# Patient Record
Sex: Female | Born: 1960
Health system: Southern US, Community
[De-identification: ages and names within clinical notes are randomized; demographics above are authoritative.]

## PROBLEM LIST (undated history)

## (undated) DIAGNOSIS — C801 Malignant (primary) neoplasm, unspecified: Secondary | ICD-10-CM

## (undated) HISTORY — PX: BREAST LUMPECTOMY: SHX2

---

## 2009-09-28 DIAGNOSIS — C50919 Malignant neoplasm of unspecified site of unspecified female breast: Secondary | ICD-10-CM

## 2009-09-28 HISTORY — DX: Malignant neoplasm of unspecified site of unspecified female breast: C50.919

## 2009-09-28 HISTORY — PX: BREAST LUMPECTOMY: SHX2

## 2014-01-11 DIAGNOSIS — F329 Major depressive disorder, single episode, unspecified: Secondary | ICD-10-CM | POA: Insufficient documentation

## 2014-01-11 DIAGNOSIS — E782 Mixed hyperlipidemia: Secondary | ICD-10-CM | POA: Insufficient documentation

## 2014-01-11 DIAGNOSIS — G479 Sleep disorder, unspecified: Secondary | ICD-10-CM | POA: Insufficient documentation

## 2014-01-11 DIAGNOSIS — D0510 Intraductal carcinoma in situ of unspecified breast: Secondary | ICD-10-CM | POA: Insufficient documentation

## 2014-01-11 DIAGNOSIS — I10 Essential (primary) hypertension: Secondary | ICD-10-CM | POA: Insufficient documentation

## 2014-01-11 DIAGNOSIS — G43909 Migraine, unspecified, not intractable, without status migrainosus: Secondary | ICD-10-CM | POA: Insufficient documentation

## 2014-01-11 DIAGNOSIS — K7581 Nonalcoholic steatohepatitis (NASH): Secondary | ICD-10-CM | POA: Insufficient documentation

## 2014-01-11 DIAGNOSIS — F32A Depression, unspecified: Secondary | ICD-10-CM | POA: Insufficient documentation

## 2017-06-01 MED FILL — traZODone HCL 50 MG TABS: 50 | 90 days supply | Qty: 90 | Fill #0

## 2017-07-02 DIAGNOSIS — N644 Mastodynia: Secondary | ICD-10-CM | POA: Diagnosis not present

## 2017-07-02 DIAGNOSIS — N950 Postmenopausal bleeding: Secondary | ICD-10-CM | POA: Diagnosis not present

## 2017-07-16 DIAGNOSIS — N950 Postmenopausal bleeding: Secondary | ICD-10-CM | POA: Diagnosis not present

## 2017-08-12 DIAGNOSIS — N882 Stricture and stenosis of cervix uteri: Secondary | ICD-10-CM | POA: Diagnosis not present

## 2017-08-12 DIAGNOSIS — D0510 Intraductal carcinoma in situ of unspecified breast: Secondary | ICD-10-CM | POA: Diagnosis not present

## 2017-08-12 DIAGNOSIS — N858 Other specified noninflammatory disorders of uterus: Secondary | ICD-10-CM | POA: Diagnosis not present

## 2017-08-12 DIAGNOSIS — N950 Postmenopausal bleeding: Secondary | ICD-10-CM | POA: Diagnosis not present

## 2017-12-20 ENCOUNTER — Other Ambulatory Visit: Payer: Self-pay | Admitting: Family Medicine

## 2017-12-20 DIAGNOSIS — Z1231 Encounter for screening mammogram for malignant neoplasm of breast: Secondary | ICD-10-CM

## 2017-12-21 MED FILL — ROSUVASTATIN CALCIUM 5 MG T: 5 | 90 days supply | Qty: 90 | Fill #0

## 2018-01-06 ENCOUNTER — Ambulatory Visit
Admission: RE | Admit: 2018-01-06 | Discharge: 2018-01-06 | Disposition: A | Discharge status: Home / Self Care | Payer: No Typology Code available for payment source | Source: Ambulatory Visit | Attending: Family Medicine | Admitting: Family Medicine

## 2018-01-06 DIAGNOSIS — Z1231 Encounter for screening mammogram for malignant neoplasm of breast: Secondary | ICD-10-CM

## 2018-01-12 ENCOUNTER — Ambulatory Visit: Payer: No Typology Code available for payment source | Admitting: Podiatry

## 2018-01-12 ENCOUNTER — Other Ambulatory Visit: Payer: Self-pay | Admitting: Podiatry

## 2018-01-12 ENCOUNTER — Encounter: Payer: Self-pay | Admitting: Podiatry

## 2018-01-12 ENCOUNTER — Ambulatory Visit (INDEPENDENT_AMBULATORY_CARE_PROVIDER_SITE_OTHER): Payer: No Typology Code available for payment source

## 2018-01-12 VITALS — BP 138/90 | HR 62 | Resp 16

## 2018-01-12 DIAGNOSIS — M2012 Hallux valgus (acquired), left foot: Secondary | ICD-10-CM | POA: Diagnosis not present

## 2018-01-12 DIAGNOSIS — M2011 Hallux valgus (acquired), right foot: Secondary | ICD-10-CM

## 2018-01-12 DIAGNOSIS — M79671 Pain in right foot: Secondary | ICD-10-CM

## 2018-01-12 DIAGNOSIS — M205X9 Other deformities of toe(s) (acquired), unspecified foot: Secondary | ICD-10-CM

## 2018-01-12 DIAGNOSIS — M779 Enthesopathy, unspecified: Secondary | ICD-10-CM | POA: Diagnosis not present

## 2018-01-12 DIAGNOSIS — M2010 Hallux valgus (acquired), unspecified foot: Secondary | ICD-10-CM | POA: Diagnosis not present

## 2018-01-12 MED ORDER — TRIAMCINOLONE ACETONIDE 10 MG/ML IJ SUSP
10.0000 mg | Freq: Once | INTRAMUSCULAR | Status: AC
  Administered 2018-01-12: 10 mg

## 2018-01-12 NOTE — Patient Instructions (Signed)

## 2018-01-12 NOTE — Progress Notes (Signed)
Subjective:   Patient ID: Stacie [name], female   DOB: 57 y.o.   MRN: [id number]   HPI Patient states she is had a long-term problem with her big toe joints with the left being first right 1 second and now currently the right one hurting the most.  Patient states this is been a number of year process and she is tried shoe gear modifications she was going to have surgery 5 years ago which she did not have gradual increase in pain and has reduced her activity levels because of it.  She no longer runs and feels like she walks on the outside of her feet.  Patient does not smoke currently   Review of Systems  All other systems reviewed and are negative.       Objective:  Physical Exam  Constitutional: She appears well-developed and well-nourished.  Cardiovascular: Intact distal pulses.  Pulmonary/Chest: Effort normal.  Musculoskeletal: Normal range of motion.  Neurological: She is alert.  Skin: Skin is warm.  Nursing note and vitals reviewed.   Neurovascular status intact muscle strength adequate range of motion within normal limits with patient found to have significant loss of motion big toe joint right left foot with crepitus of the left first MPJ.  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  Patient does have family history of not successful results and at this time I am sending the ped orthotist for orthotics with some kind of extension of the first metatarsal to try to reduce  the stress against the joint surface  X-rays indicate that there is spurring of the first MPJ bilateral with flattening of the joint surface left over right

## 2018-01-20 ENCOUNTER — Ambulatory Visit (INDEPENDENT_AMBULATORY_CARE_PROVIDER_SITE_OTHER): Payer: No Typology Code available for payment source | Admitting: Orthotics

## 2018-01-20 DIAGNOSIS — M2012 Hallux valgus (acquired), left foot: Secondary | ICD-10-CM

## 2018-01-20 DIAGNOSIS — M2010 Hallux valgus (acquired), unspecified foot: Secondary | ICD-10-CM

## 2018-01-20 DIAGNOSIS — M205X9 Other deformities of toe(s) (acquired), unspecified foot: Secondary | ICD-10-CM

## 2018-01-20 DIAGNOSIS — M2011 Hallux valgus (acquired), right foot: Secondary | ICD-10-CM

## 2018-01-20 NOTE — Progress Notes (Signed)
Patient see today for CMFO per dr. [name].  Patient has hx of valgus deformity of great toe bilat resulting in structural hallux limitus.  This is most likely secondary to pes cavus foot type.   Bilateral morton's extension and hug arch to take pressure off of forefoot.

## 2018-02-15 ENCOUNTER — Ambulatory Visit: Payer: No Typology Code available for payment source | Admitting: Orthotics

## 2018-02-15 DIAGNOSIS — M205X9 Other deformities of toe(s) (acquired), unspecified foot: Secondary | ICD-10-CM

## 2018-02-15 DIAGNOSIS — M2011 Hallux valgus (acquired), right foot: Secondary | ICD-10-CM

## 2018-02-15 DIAGNOSIS — M2012 Hallux valgus (acquired), left foot: Secondary | ICD-10-CM

## 2018-02-15 NOTE — Progress Notes (Signed)
Patient came in today to pick up custom made foot orthotics.  The goals were accomplished and the patient reported no dissatisfaction with said orthotics.  Patient was advised of breakin period and how to report any issues. 

## 2018-03-18 MED FILL — ROSUVASTATIN CALCIUM 5 MG T: 5 | 90 days supply | Qty: 90 | Fill #1

## 2018-04-25 ENCOUNTER — Encounter: Payer: Self-pay | Admitting: Podiatry

## 2018-04-25 ENCOUNTER — Ambulatory Visit: Payer: No Typology Code available for payment source | Admitting: Podiatry

## 2018-04-25 DIAGNOSIS — M779 Enthesopathy, unspecified: Secondary | ICD-10-CM | POA: Diagnosis not present

## 2018-04-25 DIAGNOSIS — M205X9 Other deformities of toe(s) (acquired), unspecified foot: Secondary | ICD-10-CM | POA: Diagnosis not present

## 2018-04-25 MED ORDER — TRIAMCINOLONE ACETONIDE 10 MG/ML IJ SUSP
10.0000 mg | Freq: Once | INTRAMUSCULAR | Status: AC
  Administered 2018-04-25: 10 mg

## 2018-04-25 NOTE — Patient Instructions (Signed)
Pre-Operative Instructions  Congratulations, you have decided to take an important step towards improving your quality of life.  You can be assured that the doctors and staff at [facility] will be with you every step of the way.  Here are some important things you should know:  1. Plan to be at the surgery center/hospital at least 1 (one) hour prior to your scheduled time, unless otherwise directed by the surgical center/hospital staff.  You must have a responsible adult accompany you, remain during the surgery and drive you home.  Make sure you have directions to the surgical center/hospital to ensure you arrive on time. 2. If you are having surgery at [facility] or [facility] hospitals, you will need a copy of your medical history and physical form from your family physician within one month prior to the date of surgery. We will give you a form for your primary physician to complete.  3. We make every effort to accommodate the date you request for surgery.  However, there are times where surgery dates or times have to be moved.  We will contact you as soon as possible if a change in schedule is required.   4. No aspirin/ibuprofen for one week before surgery.  If you are on aspirin, any non-steroidal anti-inflammatory medications (Mobic, Aleve, Ibuprofen) should not be taken seven (7) days prior to your surgery.  You make take Tylenol for pain prior to surgery.  5. Medications - If you are taking daily heart and blood pressure medications, seizure, reflux, allergy, asthma, anxiety, pain or diabetes medications, make sure you notify the surgery center/hospital before the day of surgery so they can tell you which medications you should take or avoid the day of surgery. 6. No food or drink after midnight the night before surgery unless directed otherwise by surgical center/hospital staff. 7. No alcoholic beverages 24-hours prior to surgery.  No smoking 24-hours prior or 24-hours after  surgery. 8. Wear loose pants or shorts. They should be loose enough to fit over bandages, boots, and casts. 9. Don't wear slip-on shoes. Sneakers are preferred. 10. Bring your boot with you to the surgery center/hospital.  Also bring crutches or a walker if your physician has prescribed it for you.  If you do not have this equipment, it will be provided for you after surgery. 11. If you have not been contacted by the surgery center/hospital by the day before your surgery, call to confirm the date and time of your surgery. 12. Leave-time from work may vary depending on the type of surgery you have.  Appropriate arrangements should be made prior to surgery with your employer. 13. Prescriptions will be provided immediately following surgery by your doctor.  Fill these as soon as possible after surgery and take the medication as directed. Pain medications will not be refilled on weekends and must be approved by the doctor. 14. Remove nail polish on the operative foot and avoid getting pedicures prior to surgery. 15. Wash the night before surgery.  The night before surgery wash the foot and leg well with water and the antibacterial soap provided. Be sure to pay special attention to beneath the toenails and in between the toes.  Wash for at least three (3) minutes. Rinse thoroughly with water and dry well with a towel.  Perform this wash unless told not to do so by your physician.  Enclosed: 1 Ice pack (please put in freezer the night before surgery)   1 Hibiclens skin cleaner     Pre-op instructions  If you have any questions regarding the instructions, please do not hesitate to call our office.  [place]: [address], [place], [place] [address] -- [phone]  [place]: [address] [place], [place] [address] -- [phone]  [place]: [address]  , [place] [address] -- [phone]  [place]: [address], [address], [place], [place] [address] -- [phone]  Website:  [email or web address] 

## 2018-04-26 NOTE — Progress Notes (Signed)
Subjective:   Patient ID: Stacie [name], female   DOB: 57 y.o.   MRN: [id number]   HPI Patient presents stating she knows she needs surgery on her right foot but she is going on a hiking trip and wants relief temporarily and then wants to have surgery done and wants to discuss today   ROS      Objective:  Physical Exam  Neurovascular status intact with patient found to have inflammation pain of the first metatarsal phalangeal joint right with fluid buildup around the joint and reduced motion of the joint itself.     Assessment:  Inflammatory capsulitis first MPJ right with hallux limitus deformity     [name]:  H&P x-rays taken today as is been a number of months and reviewed in great detail.  I do recommend a osteotomy type surgery with removal of bone spurs and I explained procedure and risk and today I went ahead and I did inject the joint 3 mg Kenalog 5 Milgram Xylocaine for temporary relief.  I allowed her to go over consent form going over alternative treatments complications and the fact that recovery will take 6 months to 1 year for complete recovery.  Patient wants surgery signed consent form is given all preoperative instructions today.  She understands ultimately she may require fusion or joint implantation procedure and I did dispense air fracture walker today with all instructions on usage  X-ray indicates that there is spurring of the dorsal first metatarsal and moderate narrowing as of the first metatarsal space

## 2018-04-29 ENCOUNTER — Telehealth: Payer: Self-pay | Admitting: Podiatry

## 2018-04-29 NOTE — Telephone Encounter (Signed)
Stacie [name], I met with you on Monday when I saw Dr. [name]. I'm calling to get my surgery scheduled for late October into November sometime. He said he was going to do an osteotomy on my right great toe joint. Please call me back at [phone]. Thank you.

## 2018-05-02 ENCOUNTER — Telehealth: Payer: Self-pay | Admitting: Podiatry

## 2018-05-02 NOTE — Telephone Encounter (Signed)
I called pt to get her surgery scheduled with Dr. [name]. Pt requested her surgery be scheduled on Tuesday 19 November as the following week is Thanksgiving and she will have time off from work. I told the pt while I had her on the phone I could go ahead and get her scheduled to come in before then to sign her consent forms. Pt stated she signed the consent forms when she was here on 29 November. I told the pt that someone from the surgical center would call her 24-48 hours before the date of her surgery to let her know what time to arrive as it depends on any changes in their schedules, if there are kids or patients that are diabetic that need to have surgery first. I confirmed with the pt that she received the brochure for [facility] and that she could go online and register with them but if she was unable to do that, someone from the surgical center would call and get the information over the phone with her. I told the pt to call us with any questions and/or concerns she may have.

## 2018-05-02 NOTE — Telephone Encounter (Signed)
I'm calling to schedule my surgery with Dr. [name]. I would like to schedule it sometime in October or November if possible. If you could give me a call back I would really appreciate it. My number is [phone]. Thanks very much. Bye bye.

## 2018-07-18 ENCOUNTER — Telehealth: Payer: Self-pay | Admitting: *Deleted

## 2018-07-18 NOTE — Telephone Encounter (Signed)
"  I have a surgery scheduled with Dr. [name] on November 19.  I wanted follow up and make sure I got all my ducks in a row.  I wanted to make sure my insurance, [employer], has authorized it.  I applied for FMLA, just to be safe.  They notified me this morning that they haven't gotten the doctor's certification back yet.  Is there anything that I need to do?  Please give me a call back.  Thank you."

## 2018-07-19 NOTE — Telephone Encounter (Signed)
I am returning your call.  I have not gotten your surgery authorized by [employer] at this time but I will take care of it.  "Okay I have something else I'd like to ask you.  I went by the [facility] to get a form for handicap parking.  Is that something I can get signed when I come in for my post-op visit?"  Yes, we can sign it when you come in and then you can take it to the [facility].  "Did you all get my FMLA paperwork on Friday?"  I'm not sure.  Helayne Seminole takes care of the [facility].  I can send you to her voicemail so you can leave her a message if you would like.  "Yes please, that will be great."  I transferred her to [employer].

## 2018-07-22 MED FILL — ROSUVASTATIN CALCIUM 5 MG T: 5 | 30 days supply | Qty: 30 | Fill #0

## 2018-08-16 ENCOUNTER — Encounter: Payer: Self-pay | Admitting: Podiatry

## 2018-08-16 MED FILL — OXYCODONE-ACETAMINOPHEN 10-: 10-325 | 4 days supply | Qty: 25 | Fill #0

## 2018-08-16 MED FILL — ONDANSETRON HCL 4 MG TABLET: 4 | 7 days supply | Qty: 20 | Fill #0

## 2018-08-17 ENCOUNTER — Telehealth: Payer: Self-pay | Admitting: *Deleted

## 2018-08-17 NOTE — Telephone Encounter (Signed)
Called and spoke with the patient and patient stated that she was having some nausea today and was feeling ok and there was no fever or chills and was taken the pain medicine as directed and I stated to call the office with any concerns or questions. Lattie Haw

## 2018-08-22 MED FILL — ROSUVASTATIN CALCIUM 5 MG T: 5 | 90 days supply | Qty: 90 | Fill #0

## 2018-08-24 ENCOUNTER — Ambulatory Visit (INDEPENDENT_AMBULATORY_CARE_PROVIDER_SITE_OTHER): Payer: No Typology Code available for payment source | Admitting: Podiatry

## 2018-08-24 ENCOUNTER — Encounter: Payer: Self-pay | Admitting: Podiatry

## 2018-08-24 ENCOUNTER — Ambulatory Visit (INDEPENDENT_AMBULATORY_CARE_PROVIDER_SITE_OTHER): Payer: No Typology Code available for payment source

## 2018-08-24 VITALS — Temp 98.8°F

## 2018-08-24 DIAGNOSIS — M2011 Hallux valgus (acquired), right foot: Secondary | ICD-10-CM

## 2018-08-24 DIAGNOSIS — M2012 Hallux valgus (acquired), left foot: Secondary | ICD-10-CM

## 2018-08-24 DIAGNOSIS — M205X9 Other deformities of toe(s) (acquired), unspecified foot: Secondary | ICD-10-CM

## 2018-08-24 NOTE — Progress Notes (Signed)
Subjective:   Patient ID: Stacie [name], female   DOB: 57 y.o.   MRN: [id number]   HPI Patient states her foot feels really good and if she does too much it still can get a little bit sore and achy   ROS      Objective:  Physical Exam  Neurovascular status intact with patient's right first MPJ healing well with wound edges well coapted hallux in rectus position and range of motion that is not impacted with no crepitus noted to the joint with movement     Assessment:  Doing well post biplanar osteotomy first metatarsal right     [name]:  H&P condition reviewed and discussed continued immobilization and continued compression and continued range of motion exercises and I am referring to physical therapy for this condition to help improve the motion of the joint.  Patient will be seen back for [place] to recheck again in 3 weeks or earlier if any issues should occur  X-rays indicate the osteotomy is healing well the joint is congruous fixation is in place with no movement

## 2018-08-24 NOTE — Addendum Note (Signed)
Addended by: Roney Jaffe on: 08/24/2018 05:19 PM   Modules accepted: Orders

## 2018-09-14 ENCOUNTER — Ambulatory Visit (INDEPENDENT_AMBULATORY_CARE_PROVIDER_SITE_OTHER): Payer: No Typology Code available for payment source

## 2018-09-14 ENCOUNTER — Other Ambulatory Visit: Payer: Self-pay | Admitting: Podiatry

## 2018-09-14 ENCOUNTER — Ambulatory Visit (INDEPENDENT_AMBULATORY_CARE_PROVIDER_SITE_OTHER): Payer: No Typology Code available for payment source | Admitting: Podiatry

## 2018-09-14 ENCOUNTER — Encounter: Payer: Self-pay | Admitting: Podiatry

## 2018-09-14 DIAGNOSIS — M2011 Hallux valgus (acquired), right foot: Secondary | ICD-10-CM

## 2018-09-14 DIAGNOSIS — M205X9 Other deformities of toe(s) (acquired), unspecified foot: Secondary | ICD-10-CM

## 2018-09-14 DIAGNOSIS — M79671 Pain in right foot: Secondary | ICD-10-CM | POA: Diagnosis not present

## 2018-09-14 DIAGNOSIS — M205X2 Other deformities of toe(s) (acquired), left foot: Secondary | ICD-10-CM

## 2018-09-17 NOTE — Progress Notes (Signed)
Subjective:   Patient ID: Stacie [name], female   DOB: 57 y.o.   MRN: [id number]   HPI Patient states improving with mild discomfort but overall very satisfied   ROS      Objective:  Physical Exam  Neurovascular status intact with patient's right foot healing well wound edges well coapted no crepitus of the joint and range of motion which is good but I would like to improve     Assessment:  Doing well post hallux limitus surgery right with mild restriction of motion     [name]:  H&P x-rays reviewed and discussed the importance of range of motion exercises and showed her how to do these and she promises she will do a better job of trying to bend the big toe.  Patient will be seen back in 4 weeks and will increase activity levels  X-rays indicate the osteotomy is healing well the joint is in good alignment and congruence with fixation in place

## 2018-10-10 ENCOUNTER — Ambulatory Visit (INDEPENDENT_AMBULATORY_CARE_PROVIDER_SITE_OTHER): Payer: No Typology Code available for payment source

## 2018-10-10 ENCOUNTER — Ambulatory Visit (INDEPENDENT_AMBULATORY_CARE_PROVIDER_SITE_OTHER): Payer: No Typology Code available for payment source | Admitting: Podiatry

## 2018-10-10 ENCOUNTER — Encounter: Payer: Self-pay | Admitting: Podiatry

## 2018-10-10 DIAGNOSIS — M2011 Hallux valgus (acquired), right foot: Secondary | ICD-10-CM | POA: Diagnosis not present

## 2018-10-10 DIAGNOSIS — M205X9 Other deformities of toe(s) (acquired), unspecified foot: Secondary | ICD-10-CM

## 2018-10-17 NOTE — Progress Notes (Signed)
Subjective:   Patient ID: Stacie [name], female   DOB: 58 y.o.   MRN: [id number]   HPI Patient states improving with pain only if she is been on her foot for long periods of time   ROS      Objective:  Physical Exam  Neurovascular status intact with patient's right foot doing better with discomfort still when she is been on it for too long time with wound edges well coapted and good range of motion     Assessment:  Overall continues to improve from having had inflammatory condition first MPJ of the right foot with alignment good no crepitus of the joint     [name]:  H&P x-rays reviewed and at this point I have recommended continued range of motion exercises anti-inflammatory and still being careful with that with the type of activity she does.  Patient will be seen back in the next 6 weeks or earlier if needed and will continue range of motion until that time  X-rays indicate that the osteotomy is healing well with joint congruence with no indications of pathology secondary to the surgery with fixation in place

## 2018-11-23 MED FILL — ROSUVASTATIN CALCIUM 5 MG T: 5 | 90 days supply | Qty: 90 | Fill #1

## 2018-12-09 ENCOUNTER — Ambulatory Visit: Payer: No Typology Code available for payment source | Admitting: Podiatry

## 2018-12-09 ENCOUNTER — Encounter: Payer: Self-pay | Admitting: Podiatry

## 2018-12-09 ENCOUNTER — Ambulatory Visit (INDEPENDENT_AMBULATORY_CARE_PROVIDER_SITE_OTHER): Payer: No Typology Code available for payment source

## 2018-12-09 ENCOUNTER — Other Ambulatory Visit: Payer: Self-pay

## 2018-12-09 DIAGNOSIS — M2011 Hallux valgus (acquired), right foot: Secondary | ICD-10-CM

## 2018-12-09 DIAGNOSIS — M779 Enthesopathy, unspecified: Secondary | ICD-10-CM

## 2018-12-12 NOTE — Progress Notes (Signed)
Subjective:   Patient ID: Stacie [name], female   DOB: 58 y.o.   MRN: [id number]   HPI Patient states overall she is doing pretty well but she knows someday she will have to have her left one done and also she knows that she is getting need insert therapy.  Also knows she is can need to have her left big toe joint fixed   ROS      Objective:  Physical Exam  Neurovascular status intact with range of motion that is continuing to improve right with no crepitus of the joint and hallux limitus deformity left     Assessment:  Doing well post osteotomy right first metatarsal with hallux limitus deformity left     [name]:  H&P discussed both conditions and discussed the possibility of orthotics and reviewed capsulitis right and reviewed and explained this to her.  I also went ahead discussed the bunion deformity left and she understands at one point future will need to be corrected but she is getting do it when her schedule allows her to and I did explain the longer she goes the more chances for arthritis.  The right overall is doing very well post osteotomy  X-ray indicates the joint is open fixation in place spurs removed adequately right

## 2019-02-28 MED FILL — ROSUVASTATIN CALCIUM 5 MG T: 5 | 90 days supply | Qty: 90 | Fill #0

## 2019-04-24 IMAGING — MG DIGITAL SCREENING BILATERAL MAMMOGRAM WITH TOMO AND CAD
8 series · 9 of 24 positions shown · non-contrast
Comparison: Previous exams 11/26/2016, 10/18/2015 and 05/21/2014
from [REDACTED].

CLINICAL DATA: Screening. Prior RIGHT breast lumpectomy.

EXAM:
DIGITAL SCREENING BILATERAL MAMMOGRAM WITH TOMO AND CAD

[R MLO synth-2D]
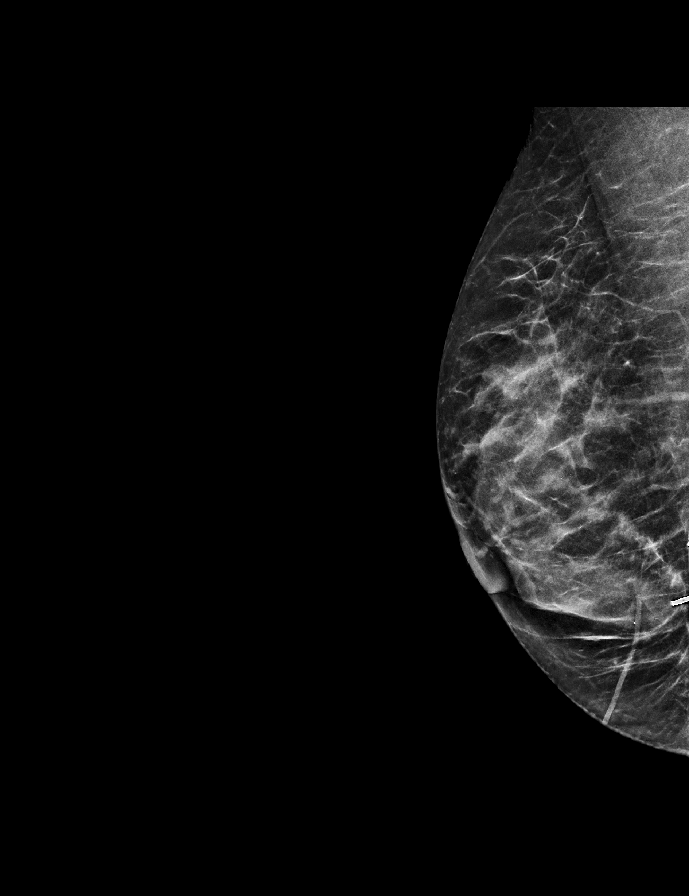

[R CC synth-2D]
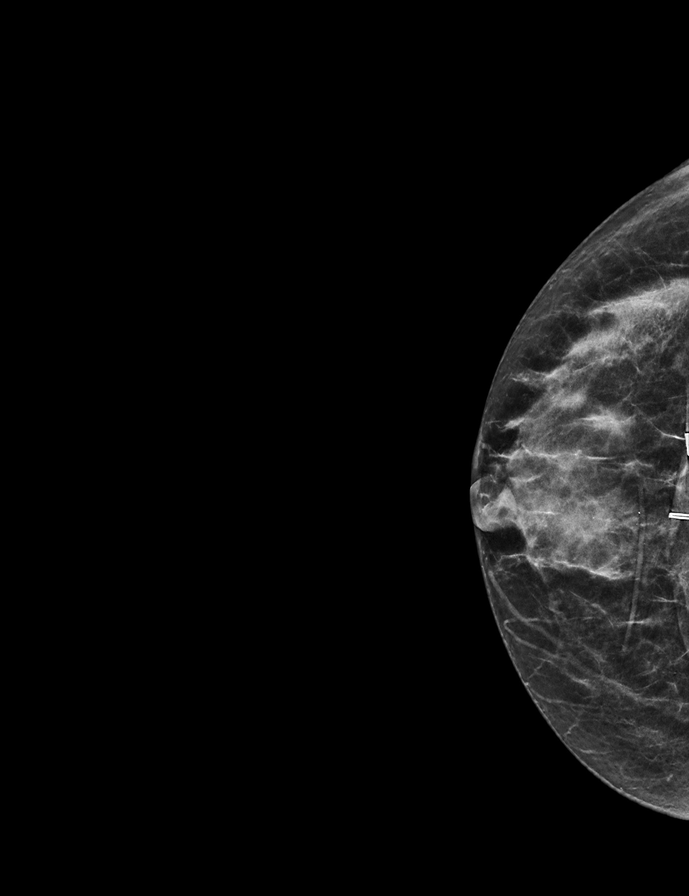

[L CC synth-2D]
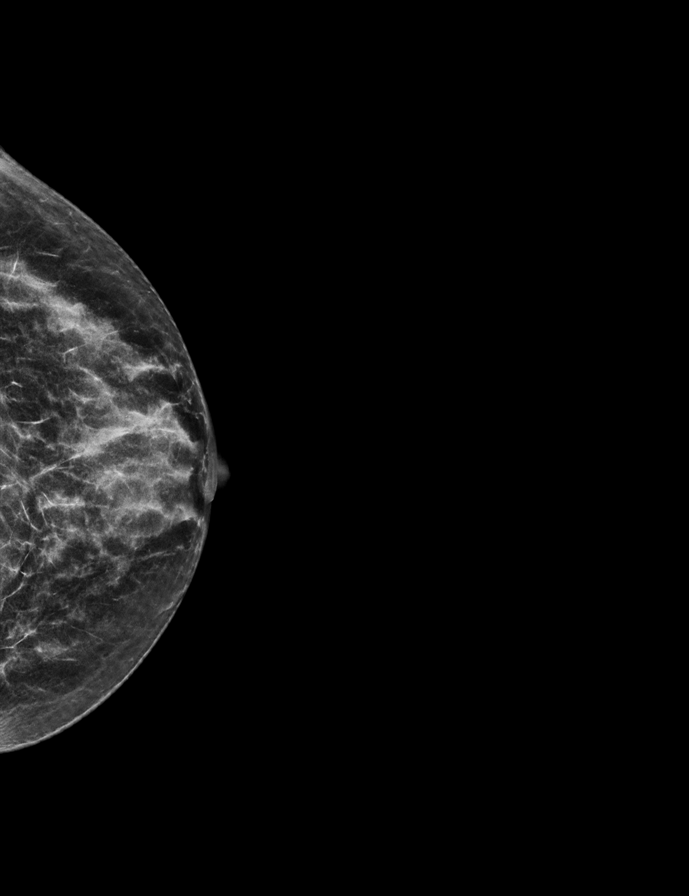

[L MLO synth-2D]
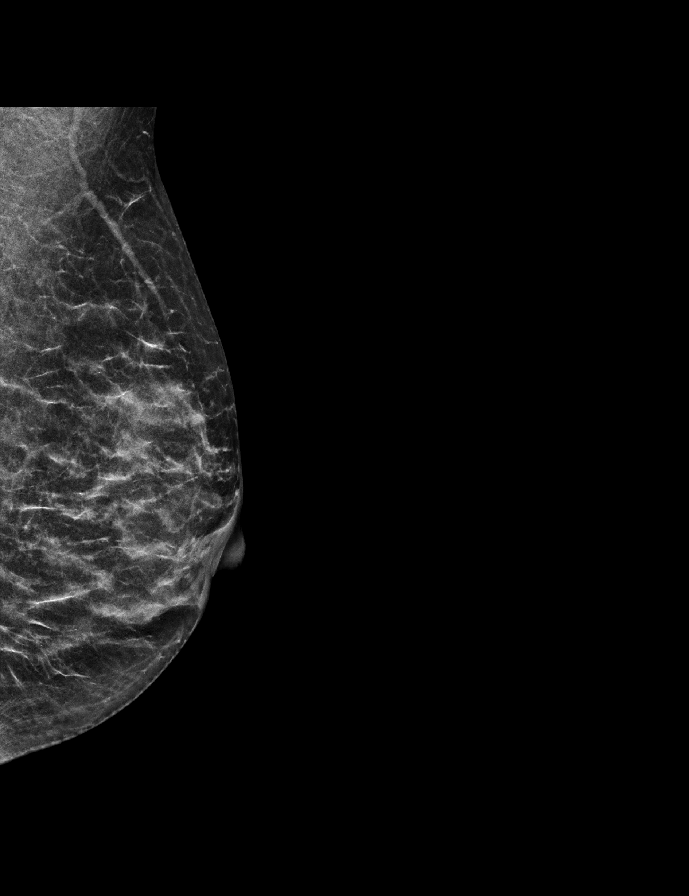

[L MLO tomo · 2 of 44 frames shown]
[frame 15/44]
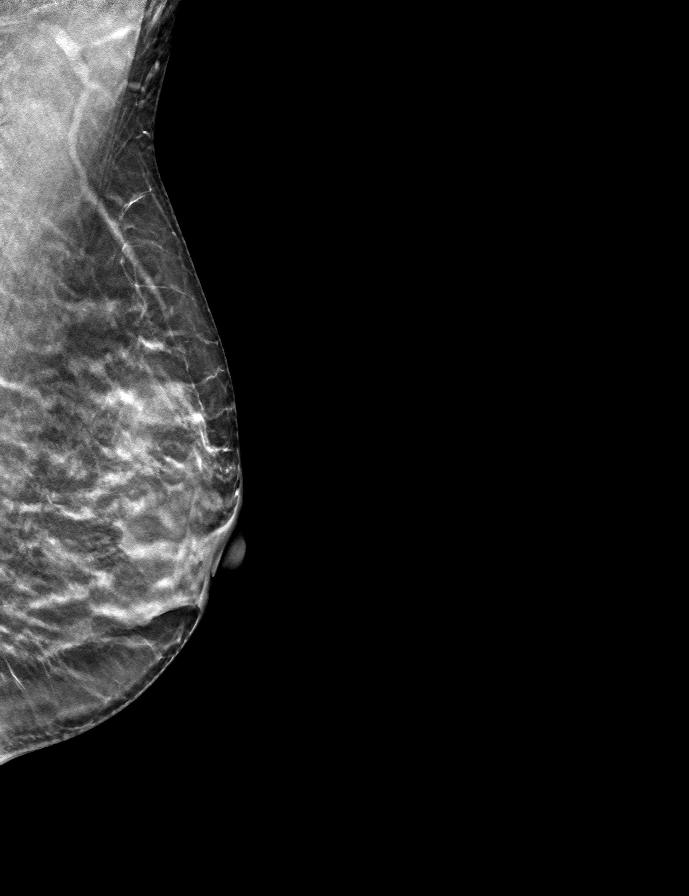
[frame 23/44]
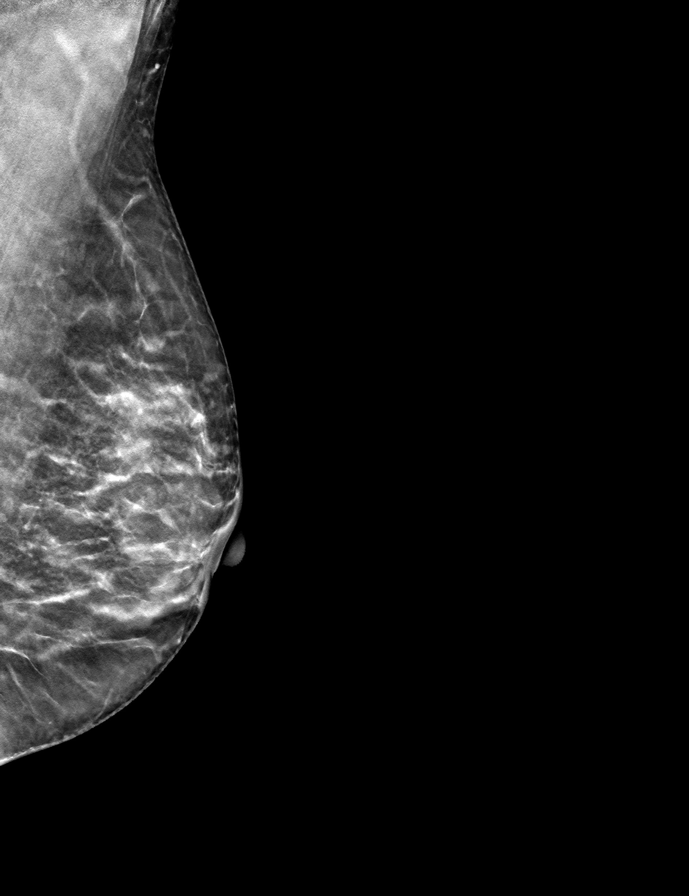

[L CC tomo · tomo slice 24/47.0]
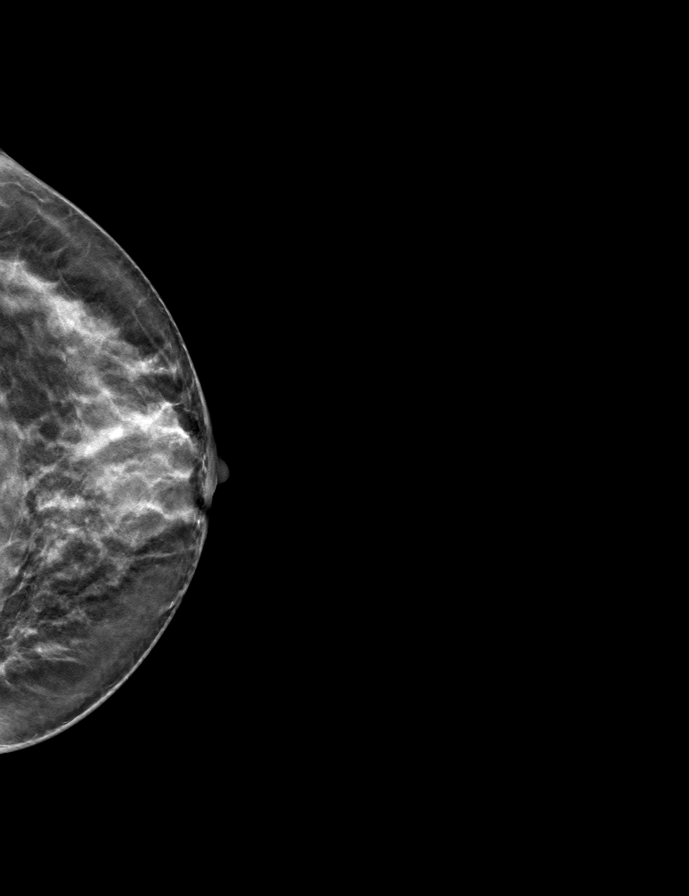

[R CC tomo · tomo slice 25/50.0]
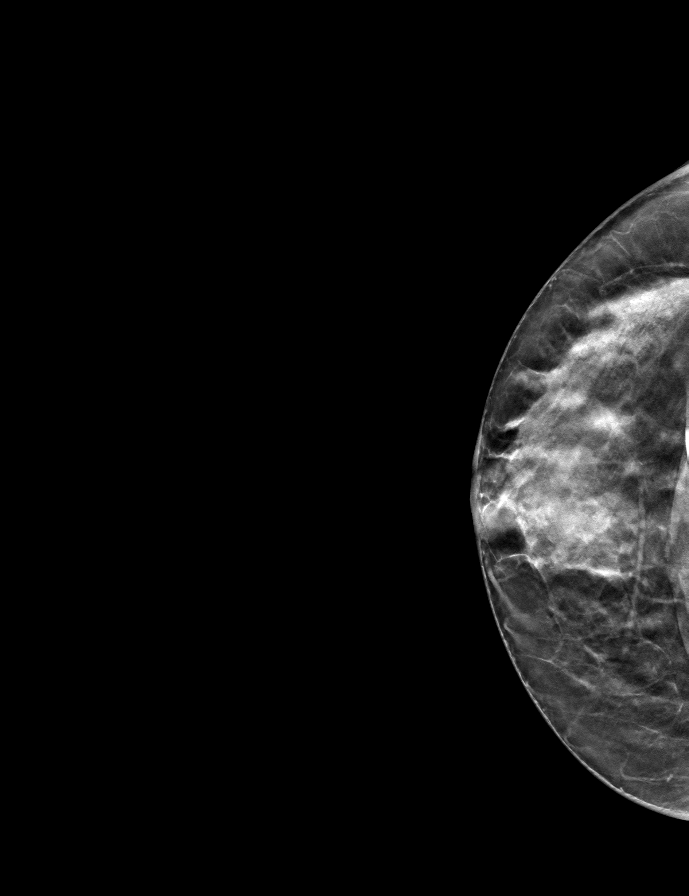

[R MLO tomo · tomo slice 25/49.0]
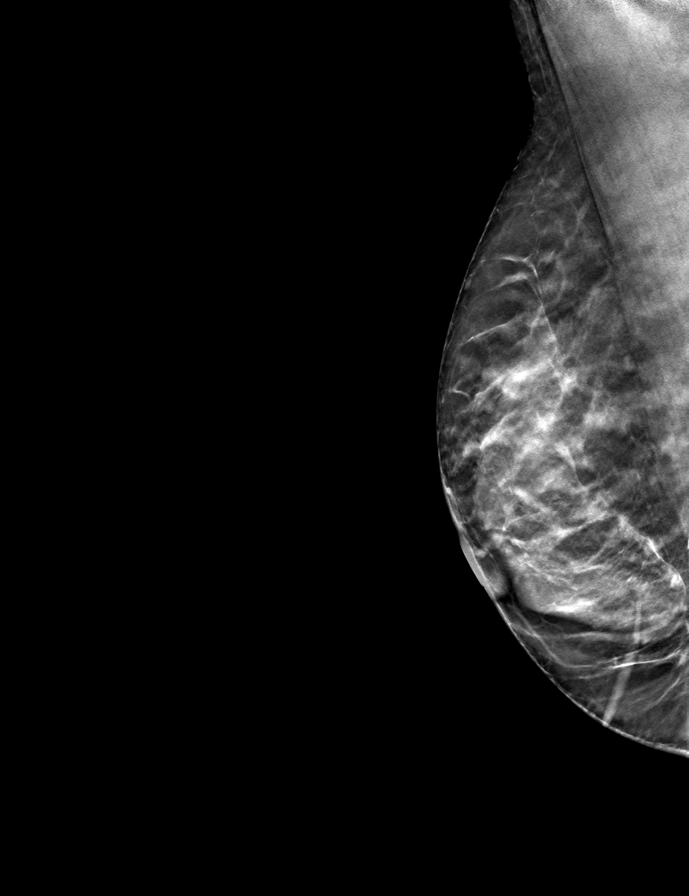

[9 of 24 positions shown; findings below may reference images not displayed]

Awaiting the prior outside
mammograms accounts for the delay in this report.

ACR Breast Density Category c: The breast tissue is heterogeneously
dense, which may obscure small masses.
FINDINGS: There are no findings suspicious for malignancy. Images were
processed with CAD.
IMPRESSION: No mammographic evidence of malignancy. A result letter of this
screening mammogram will be mailed directly to the patient.

RECOMMENDATION:
Screening mammogram in one year. (Code:EG-Q-6HB)

BI-RADS CATEGORY  2: Benign.

## 2019-05-23 MED FILL — ROSUVASTATIN CALCIUM 5 MG T: 5 | 90 days supply | Qty: 90 | Fill #1

## 2019-08-29 MED FILL — ROSUVASTATIN CALCIUM 5 MG T: 5 | 90 days supply | Qty: 90 | Fill #0

## 2019-10-17 MED FILL — DOXYCYCLINE HYCLATE 100 MG: 100 | 10 days supply | Qty: 20 | Fill #0

## 2019-11-13 MED FILL — SUMAtriptan SUCCINATE 50 MG: 50 | 25 days supply | Qty: 9 | Fill #0

## 2019-11-22 ENCOUNTER — Other Ambulatory Visit (HOSPITAL_COMMUNITY)
Admission: RE | Admit: 2019-11-22 | Discharge: 2019-11-22 | Disposition: A | Discharge status: Home / Self Care | Payer: No Typology Code available for payment source | Source: Ambulatory Visit | Attending: Family Medicine | Admitting: Family Medicine

## 2019-11-22 ENCOUNTER — Other Ambulatory Visit: Payer: Self-pay | Admitting: Family Medicine

## 2019-11-22 DIAGNOSIS — Z124 Encounter for screening for malignant neoplasm of cervix: Secondary | ICD-10-CM | POA: Insufficient documentation

## 2019-11-22 MED FILL — CITALOPRAM HBR 20 MG TABLET: 20 | 30 days supply | Qty: 30 | Fill #0

## 2019-11-23 ENCOUNTER — Other Ambulatory Visit: Payer: Self-pay | Admitting: Family Medicine

## 2019-11-23 DIAGNOSIS — Z1231 Encounter for screening mammogram for malignant neoplasm of breast: Secondary | ICD-10-CM

## 2019-11-24 LAB — CYTOLOGY - PAP
Comment: NEGATIVE
Diagnosis: NEGATIVE
High risk HPV: NEGATIVE

## 2019-12-04 MED FILL — ROSUVASTATIN CALCIUM 5 MG T: 5 | 90 days supply | Qty: 90 | Fill #1

## 2019-12-18 MED FILL — CITALOPRAM HBR 20 MG TABLET: 20 | 30 days supply | Qty: 30 | Fill #1

## 2020-01-16 ENCOUNTER — Ambulatory Visit
Admission: RE | Admit: 2020-01-16 | Discharge: 2020-01-16 | Disposition: A | Discharge status: Home / Self Care | Payer: No Typology Code available for payment source | Source: Ambulatory Visit | Attending: Family Medicine | Admitting: Family Medicine

## 2020-01-16 ENCOUNTER — Other Ambulatory Visit: Payer: Self-pay

## 2020-01-16 DIAGNOSIS — Z1231 Encounter for screening mammogram for malignant neoplasm of breast: Secondary | ICD-10-CM

## 2020-01-16 HISTORY — DX: Malignant (primary) neoplasm, unspecified: C80.1

## 2020-01-18 MED FILL — CITALOPRAM HBR 20 MG TABLET: 20 | 30 days supply | Qty: 30 | Fill #0

## 2020-02-14 MED FILL — CITALOPRAM HBR 20 MG TABLET: 20 | 90 days supply | Qty: 90 | Fill #0

## 2020-02-29 MED FILL — ROSUVASTATIN CALCIUM 5 MG T: 5 | 90 days supply | Qty: 90 | Fill #0

## 2020-05-13 ENCOUNTER — Other Ambulatory Visit (HOSPITAL_COMMUNITY): Payer: Self-pay | Admitting: Family Medicine

## 2020-05-13 MED FILL — CITALOPRAM HBR 20 MG TABLET: 20 | 90 days supply | Qty: 90 | Fill #0

## 2020-05-30 MED FILL — ROSUVASTATIN CALCIUM 5 MG T: 5 | 90 days supply | Qty: 90 | Fill #1

## 2020-08-13 MED FILL — CITALOPRAM HBR 20 MG TABLET: 20 | 90 days supply | Qty: 90 | Fill #1

## 2020-08-27 ENCOUNTER — Other Ambulatory Visit (HOSPITAL_COMMUNITY): Payer: Self-pay | Admitting: Family Medicine

## 2020-08-27 MED FILL — ROSUVASTATIN CALCIUM 5 MG T: 5 | 90 days supply | Qty: 90 | Fill #0

## 2020-11-05 ENCOUNTER — Other Ambulatory Visit (HOSPITAL_COMMUNITY): Payer: Self-pay | Admitting: Family Medicine

## 2020-11-05 MED FILL — CITALOPRAM HBR 20 MG TABLET: 20 | 90 days supply | Qty: 90 | Fill #0

## 2020-11-21 MED FILL — ROSUVASTATIN CALCIUM 5 MG T: 5 | 90 days supply | Qty: 90 | Fill #1

## 2020-11-25 ENCOUNTER — Other Ambulatory Visit (HOSPITAL_COMMUNITY): Payer: Self-pay | Admitting: Family Medicine

## 2020-11-25 MED FILL — SUMAtriptan SUCCINATE 50 MG: 50 | 30 days supply | Qty: 18 | Fill #0

## 2020-11-26 ENCOUNTER — Other Ambulatory Visit: Payer: Self-pay | Admitting: Family Medicine

## 2020-11-26 DIAGNOSIS — Z1231 Encounter for screening mammogram for malignant neoplasm of breast: Secondary | ICD-10-CM

## 2021-01-23 ENCOUNTER — Ambulatory Visit: Payer: No Typology Code available for payment source

## 2021-01-23 ENCOUNTER — Ambulatory Visit
Admission: RE | Admit: 2021-01-23 | Discharge: 2021-01-23 | Disposition: A | Discharge status: Home / Self Care | Payer: No Typology Code available for payment source | Source: Ambulatory Visit | Attending: Family Medicine | Admitting: Family Medicine

## 2021-01-23 ENCOUNTER — Other Ambulatory Visit: Payer: Self-pay

## 2021-01-23 DIAGNOSIS — Z1231 Encounter for screening mammogram for malignant neoplasm of breast: Secondary | ICD-10-CM

## 2021-02-14 ENCOUNTER — Other Ambulatory Visit (HOSPITAL_COMMUNITY): Payer: Self-pay

## 2021-02-14 MED ORDER — CITALOPRAM HYDROBROMIDE 20 MG PO TABS
20.0000 mg | ORAL_TABLET | Freq: Every day | ORAL | 1 refills | Status: AC
  Filled 2021-02-14: qty 90, 90d supply, fill #0
  Filled 2021-05-12: qty 90, 90d supply, fill #1

## 2021-02-15 ENCOUNTER — Other Ambulatory Visit (HOSPITAL_COMMUNITY): Payer: Self-pay

## 2021-02-17 ENCOUNTER — Other Ambulatory Visit (HOSPITAL_COMMUNITY): Payer: Self-pay

## 2021-02-25 ENCOUNTER — Other Ambulatory Visit (HOSPITAL_COMMUNITY): Payer: Self-pay

## 2021-02-25 MED ORDER — ROSUVASTATIN CALCIUM 5 MG PO TABS
5.0000 mg | ORAL_TABLET | Freq: Every day | ORAL | 0 refills | Status: AC
Start: 2021-02-25 — End: ?
  Filled 2021-02-25: qty 90, 90d supply, fill #0

## 2021-02-26 ENCOUNTER — Other Ambulatory Visit (HOSPITAL_COMMUNITY): Payer: Self-pay

## 2021-02-26 MED ORDER — CARESTART COVID-19 HOME TEST VI KIT
PACK | 0 refills | Status: DC
  Filled 2021-02-26: qty 4, 4d supply, fill #0

## 2021-03-04 ENCOUNTER — Other Ambulatory Visit (HOSPITAL_COMMUNITY): Payer: Self-pay

## 2021-03-04 MED ORDER — CARESTART COVID-19 HOME TEST VI KIT
PACK | 0 refills | Status: AC
  Filled 2021-03-04: qty 4, 4d supply, fill #0

## 2021-03-06 ENCOUNTER — Other Ambulatory Visit (HOSPITAL_COMMUNITY): Payer: Self-pay

## 2021-05-12 ENCOUNTER — Other Ambulatory Visit (HOSPITAL_COMMUNITY): Payer: Self-pay

## 2021-05-28 ENCOUNTER — Other Ambulatory Visit (HOSPITAL_COMMUNITY): Payer: Self-pay

## 2021-05-28 MED ORDER — CITALOPRAM HYDROBROMIDE 10 MG PO TABS
10.0000 mg | ORAL_TABLET | Freq: Every day | ORAL | 1 refills | Status: AC
  Filled 2021-05-28: qty 90, 90d supply, fill #0

## 2021-05-28 MED ORDER — ROSUVASTATIN CALCIUM 5 MG PO TABS
5.0000 mg | ORAL_TABLET | Freq: Every day | ORAL | 1 refills | Status: DC
  Filled 2021-05-28: qty 90, 90d supply, fill #0
  Filled 2021-08-27: qty 90, 90d supply, fill #1

## 2021-05-30 ENCOUNTER — Other Ambulatory Visit (HOSPITAL_COMMUNITY): Payer: Self-pay

## 2021-05-30 MED ORDER — QUICKVUE AT-HOME COVID-19 TEST VI KIT
PACK | 0 refills | Status: AC
  Filled 2021-05-30: qty 2, 2d supply, fill #0

## 2021-08-27 ENCOUNTER — Other Ambulatory Visit (HOSPITAL_COMMUNITY): Payer: Self-pay

## 2021-10-03 NOTE — Progress Notes (Signed)
Stacie [name] [address] [place] Phone: [phone] Subjective:   IVilma [name], am serving as a scribe for Dr. Hulan Saas. This visit occurred during the SARS-CoV-2 public health emergency.  Safety protocols were in place, including screening questions prior to the visit, additional usage of staff PPE, and extensive cleaning of exam room while observing appropriate contact time as indicated for disinfecting solutions.   I'm seeing this patient by the request  of:  Rankins, Bill Salinas, MD  CC: joint pain   SVX:[id number]  Stacie [name] is a 61 y.o. female coming in with complaint of joint pain. Patient states has tennis elbow that was diagnoses last month. Had golfers elbow a year ago. All on right side. Bunions on both feet, surgery on the right, but left foot is hurting top slightly medial. Pain in foot is sharp aggravated by activity. Wants to know prevention. Knee pain and neck pain and stiffness. Plays pickleball and hike.       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   Cancer [facility])    Past Surgical History:  Procedure Laterality Date   BREAST LUMPECTOMY Right 2011   DCIS   Social History   Socioeconomic History   Marital status: Married    Spouse name: Not on file   Number of children: Not on file   Years of education: Not on file   Highest education level: Not on file  Occupational History   Not on file  Tobacco Use   Smoking status: Never   Smokeless tobacco: Never  Substance and Sexual Activity   Alcohol use: Not on file   Drug use: Not on file   Sexual activity: Not on file  Other Topics Concern   Not on file  Social History Narrative   Not on file   Social Determinants of Health   Financial Resource Strain: Not on file  Food Insecurity: Not on file  Transportation Needs: Not on file  Physical Activity: Not on file  Stress: Not on file  Social Connections:  Not on file   Allergies  Allergen Reactions   Codeine Other (See Comments)    Hydrocodone and Oxycodone okay per patient hallucinations    Penicillins Hives and Rash   Oxycodone Nausea Only   No family history on file.   Current Outpatient Medications (Cardiovascular):    rosuvastatin (CRESTOR) 5 MG tablet, Take 5 mg by mouth daily.   rosuvastatin (CRESTOR) 5 MG tablet, TAKE 1 TABLET BY MOUTH ONCE A DAY   rosuvastatin (CRESTOR) 5 MG tablet, Take 1 tablet (5 mg total) by mouth daily.   rosuvastatin (CRESTOR) 5 MG tablet, Take 1 tablet (5 mg total) by mouth daily.  Current Outpatient Medications (Respiratory):    cetirizine (ZYRTEC) 10 MG tablet, Take 10 mg by mouth daily.   chlorpheniramine (CHLOR-TRIMETON) 4 MG tablet, Take 4 mg by mouth 2 (two) times daily as needed for allergies.  Current Outpatient Medications (Analgesics):    SUMAtriptan (IMITREX) 50 MG tablet, Take 50 mg by mouth as needed.    SUMAtriptan (IMITREX) 50 MG tablet, TAKE 1 TABLET BY MOUTH ONCE A DAY AS NEEDED   Current Outpatient Medications (Other):    CALCIUM-VITAMIN D PO, Take 1 tablet by mouth 2 (two) times daily.   citalopram (CELEXA) 10 MG tablet, Take 1 tablet (10 mg total) by mouth daily.   citalopram (CELEXA) 20 MG tablet, Take 1  tablet (20 mg total) by mouth daily.   Coenzyme Q10 (COQ10) 200 MG CAPS, Take 1 tablet by mouth daily.   COVID-19 At Home Antigen Test [facility] COVID-19 HOME TEST) KIT, use as directed   COVID-19 At Home Antigen Test (QUICKVUE AT-HOME COVID-19 TEST) KIT, Use as directed.   Melatonin 3 MG CAPS, Take 1 capsule by mouth daily.   Multiple Vitamin (MULTI-VITAMINS) TABS, Take by mouth.   Omega-3 Fatty Acids (FISH OIL) 1000 MG CAPS, Take by mouth.   Turmeric Curcumin 500 MG CAPS, Take by mouth.   Reviewed prior external information including notes and imaging from  primary care provider As well as notes that were available from care everywhere and other healthcare  systems.  Past medical history, social, surgical and family history all reviewed in electronic medical record.  No pertanent information unless stated regarding to the chief complaint.   Review of Systems:  No headache, visual changes, nausea, vomiting, diarrhea, constipation, dizziness, abdominal pain, skin rash, fevers, chills, night sweats, weight loss, swollen lymph nodes, body aches, joint swelling, chest pain, shortness of breath, mood changes. POSITIVE muscle aches  Objective  Blood pressure 136/86, pulse 84, weight 124 lb (56.2 kg), SpO2 97 %.   General: No apparent distress alert and oriented x3 mood and affect normal, dressed appropriately.  HEENT: Pupils equal, extraocular movements intact  Respiratory: Patient's speak in full sentences and does not appear short of breath  Cardiovascular: No lower extremity edema, non tender, no erythema  Gait normal with good balance and coordination.  MSK: Right elbow exam shows the patient is some tenderness to palpation over the medial epicondylar region.  Negative Tinel sign noted.  Worsening pain with flexion of the wrist. Foot exam shows the patient does have significant breakdown of the transverse arch bilaterally left greater than right.  Patient does have bunion and bunion formation noted.  Patient does have splaying between the first and second toes and does have a abnormal joint between the second and third toes noted.  Limited muscular skeletal ultrasound was performed and interpreted by Hulan Saas, M  Limited ultrasound of patient's right elbow shows the patient does have some chronic tearing with some increase in neovascularization at the insertion of the tendon of the common flexor tendon at the medial epicondylar region.  Patient's median nerve in the surrounding area does seem to have chronic subluxation but no erythema or any hypoechoic changes.  Patient has no pain with compression in that area. Impression: Medial  epicondylitis    Impression and Recommendations:     The above documentation has been reviewed and is accurate and complete Stacie Pulley, DO

## 2021-10-07 ENCOUNTER — Ambulatory Visit: Payer: Self-pay

## 2021-10-07 ENCOUNTER — Other Ambulatory Visit: Payer: Self-pay

## 2021-10-07 ENCOUNTER — Ambulatory Visit: Payer: No Typology Code available for payment source | Admitting: Family Medicine

## 2021-10-07 VITALS — BP 136/86 | HR 84 | Wt 124.0 lb

## 2021-10-07 DIAGNOSIS — M2550 Pain in unspecified joint: Secondary | ICD-10-CM

## 2021-10-07 DIAGNOSIS — M79672 Pain in left foot: Secondary | ICD-10-CM | POA: Diagnosis not present

## 2021-10-07 DIAGNOSIS — M216X2 Other acquired deformities of left foot: Secondary | ICD-10-CM

## 2021-10-07 DIAGNOSIS — M7701 Medial epicondylitis, right elbow: Secondary | ICD-10-CM

## 2021-10-07 LAB — COMPREHENSIVE METABOLIC PANEL
ALT: 16 U/L (ref 0–35)
AST: 17 U/L (ref 0–37)
Albumin: 4.7 g/dL (ref 3.5–5.2)
Alkaline Phosphatase: 89 U/L (ref 39–117)
BUN: 12 mg/dL (ref 6–23)
CO2: 25 mEq/L (ref 19–32)
Calcium: 9.8 mg/dL (ref 8.4–10.5)
Chloride: 103 mEq/L (ref 96–112)
Creatinine, Ser: 0.65 mg/dL (ref 0.40–1.20)
GFR: 95.23 mL/min (ref >60.00)
Glucose, Bld: 111 mg/dL — ABNORMAL HIGH (ref 70–99)
Potassium: 4 mEq/L (ref 3.5–5.1)
Sodium: 139 mEq/L (ref 135–145)
Total Bilirubin: 1.3 mg/dL — ABNORMAL HIGH (ref 0.2–1.2)
Total Protein: 6.8 g/dL (ref 6.0–8.3)

## 2021-10-07 LAB — CBC WITH DIFFERENTIAL/PLATELET
Basophils Absolute: 0 10*3/uL (ref 0.0–0.1)
Basophils Relative: 1 % (ref 0.0–3.0)
Eosinophils Absolute: 0.1 10*3/uL (ref 0.0–0.7)
Eosinophils Relative: 1.3 % (ref 0.0–5.0)
HCT: 39.5 % (ref 36.0–46.0)
Hemoglobin: 13.5 g/dL (ref 12.0–15.0)
Lymphocytes Relative: 21.7 % (ref 12.0–46.0)
Lymphs Abs: 1 10*3/uL (ref 0.7–4.0)
MCHC: 34.2 g/dL (ref 30.0–36.0)
MCV: 91.1 fl (ref 78.0–100.0)
Monocytes Absolute: 0.4 10*3/uL (ref 0.1–1.0)
Monocytes Relative: 8.6 % (ref 3.0–12.0)
Neutro Abs: 3 10*3/uL (ref 1.4–7.7)
Neutrophils Relative %: 67.4 % (ref 43.0–77.0)
Platelets: 286 10*3/uL (ref 150.0–400.0)
RBC: 4.34 Mil/uL (ref 3.87–5.11)
RDW: 12.3 % (ref 11.5–15.5)
WBC: 4.4 10*3/uL (ref 4.0–10.5)

## 2021-10-07 LAB — IBC PANEL
Iron: 99 ug/dL (ref 42–145)
Saturation Ratios: 25.1 % (ref 20.0–50.0)
TIBC: 394.8 ug/dL (ref 250.0–450.0)
Transferrin: 282 mg/dL (ref 212.0–360.0)

## 2021-10-07 LAB — SEDIMENTATION RATE: Sed Rate: 4 mm/hr (ref 0–30)

## 2021-10-07 LAB — URIC ACID: Uric Acid, Serum: 4.3 mg/dL (ref 2.4–7.0)

## 2021-10-07 LAB — VITAMIN D 25 HYDROXY (VIT D DEFICIENCY, FRACTURES): VITD: 47.83 ng/mL (ref 30.00–100.00)

## 2021-10-07 LAB — FERRITIN: Ferritin: 61.1 ng/mL (ref 10.0–291.0)

## 2021-10-07 NOTE — Patient Instructions (Addendum)
Labs today Stacie [name] sports protein supplement Do prescribed exercises at least 3x a week See you again in 4-6 weeks

## 2021-10-07 NOTE — Assessment & Plan Note (Signed)
Loss of the transverse arch.  Discussed with patient about over-the-counter orthotics.  May need custom orthotics in the long run.  We discussed proper shoes and avoiding being barefoot.  Follow-up again in 6 to 8 weeks

## 2021-10-07 NOTE — Assessment & Plan Note (Signed)
Partial tearing noted.  Patient has had some chronic tendinitis this previously.  Patient is a vegan.  Discussed with protein supplementation.  We will get laboratory work-up to see if anything is causing Chronic problem with his tendinitis as patient continues to have.  Discussed wrist brace and given home exercise by further treatment.  Follow-up again in 4-6

## 2021-10-10 LAB — PTH, INTACT AND CALCIUM
Calcium: 10.2 mg/dL (ref 8.6–10.4)
PTH: 28 pg/mL (ref 16–77)

## 2021-10-30 ENCOUNTER — Other Ambulatory Visit (HOSPITAL_COMMUNITY): Payer: Self-pay

## 2021-10-30 MED ORDER — CITALOPRAM HYDROBROMIDE 10 MG PO TABS
10.0000 mg | ORAL_TABLET | Freq: Every day | ORAL | 1 refills | Status: AC
  Filled 2021-10-30: qty 90, 90d supply, fill #0
  Filled 2022-02-04: qty 90, 90d supply, fill #1

## 2021-11-10 NOTE — Progress Notes (Signed)
[place] Phone: [phone] Subjective:   Stacie [name], am serving as a scribe for Dr. Hulan Saas. This visit occurred during the SARS-CoV-2 public health emergency.  Safety protocols were in place, including screening questions prior to the visit, additional usage of staff PPE, and extensive cleaning of exam room while observing appropriate contact time as indicated for disinfecting solutions.  I'm seeing this patient by the request  of:  Rankins, Bill Salinas, MD  CC: Right elbow pain  BVA:[id number]  10/07/2021 Loss of the transverse arch.  Discussed with patient about over-the-counter orthotics.  May need custom orthotics in the long run.  We discussed proper shoes and avoiding being barefoot.  Follow-up again in 6 to 8 weeks  Partial tearing noted.  Patient has had some chronic tendinitis this previously.  Patient is a vegan.  Discussed with protein supplementation.  We will get laboratory work-up to see if anything is causing Chronic problem with his tendinitis as patient continues to have.  Discussed wrist brace and given home exercise by further treatment.  Follow-up again in 4-6  Update 11/11/2021 Stacie [name] is a 61 y.o. female coming in with complaint of L foot and R elbow pain. Had lab work drawn last visit. Patient states that she was compliant with wearing brace and continues to wear it at night. Patient has been playing more with L hand and is now developing pain in L elbow. Using chopat braces for each elbow. Wakes up in pain in the mornings.   Wearing insoles which seem to be helping foot pain. Would like to know if she can run or stick to rowing.        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   Cancer [facility])    Past Surgical History:  Procedure Laterality Date   BREAST LUMPECTOMY Right 2011   DCIS   Social History   Socioeconomic History    Marital status: Single    Spouse name: Not on file   Number of children: Not on file   Years of education: Not on file   Highest education level: Not on file  Occupational History   Not on file  Tobacco Use   Smoking status: Never   Smokeless tobacco: Never  Substance and Sexual Activity   Alcohol use: Not on file   Drug use: Not on file   Sexual activity: Not on file  Other Topics Concern   Not on file  Social History Narrative   Not on file   Social Determinants of Health   Financial Resource Strain: Not on file  Food Insecurity: Not on file  Transportation Needs: Not on file  Physical Activity: Not on file  Stress: Not on file  Social Connections: Not on file   Allergies  Allergen Reactions   Codeine Other (See Comments)    Hydrocodone and Oxycodone okay per patient hallucinations    Penicillins Hives and Rash   Oxycodone Nausea Only   [name] family history on file.   Current Outpatient Medications (Cardiovascular):    rosuvastatin (CRESTOR) 5 MG tablet, Take 5 mg by mouth daily.   rosuvastatin (CRESTOR) 5 MG tablet, TAKE 1 TABLET BY MOUTH ONCE A DAY   rosuvastatin (CRESTOR) 5 MG tablet, Take 1 tablet (5 mg total) by mouth daily.   rosuvastatin (CRESTOR) 5 MG tablet, Take 1 tablet (5 mg total) by mouth daily.  Current Outpatient Medications (Respiratory):    cetirizine (ZYRTEC) 10 MG tablet, Take 10 mg by mouth daily.   chlorpheniramine (CHLOR-TRIMETON) 4 MG tablet, Take 4 mg by mouth 2 (two) times daily as needed for allergies.  Current Outpatient Medications (Analgesics):    SUMAtriptan (IMITREX) 50 MG tablet, Take 50 mg by mouth as needed.    SUMAtriptan (IMITREX) 50 MG tablet, TAKE 1 TABLET BY MOUTH ONCE A DAY AS NEEDED   Current Outpatient Medications (Other):    CALCIUM-VITAMIN D PO, Take 1 tablet by mouth 2 (two) times daily.   citalopram (CELEXA) 10 MG tablet, Take 1 tablet (10 mg total) by mouth daily.   citalopram (CELEXA) 10 MG tablet, Take 1 tablet  (10 mg total) by mouth daily.   citalopram (CELEXA) 20 MG tablet, Take 1 tablet (20 mg total) by mouth daily.   Coenzyme Q10 (COQ10) 200 MG CAPS, Take 1 tablet by mouth daily.   COVID-19 At Home Antigen Test [facility] COVID-19 HOME TEST) KIT, use as directed   COVID-19 At Home Antigen Test (QUICKVUE AT-HOME COVID-19 TEST) KIT, Use as directed.   Melatonin 3 MG CAPS, Take 1 capsule by mouth daily.   Multiple Vitamin (MULTI-VITAMINS) TABS, Take by mouth.   Omega-3 Fatty Acids (FISH OIL) 1000 MG CAPS, Take by mouth.   Turmeric Curcumin 500 MG CAPS, Take by mouth.   Reviewed prior external information including notes and imaging from  primary care provider As well as notes that were available from care everywhere and other healthcare systems.  Past medical history, social, surgical and family history all reviewed in electronic medical record.  [name] pertanent information unless stated regarding to the chief complaint.   Review of Systems:  [name] headache, visual changes, nausea, vomiting, diarrhea, constipation, dizziness, abdominal pain, skin rash, fevers, chills, night sweats, weight loss, swollen lymph nodes, body aches, joint swelling, chest pain, shortness of breath, mood changes. POSITIVE muscle aches  Objective  Blood pressure 128/82, pulse 64, height $RemoveBe'5\' 5"'ZWxCKcnqE$  (1.651 m), weight 124 lb (56.2 kg), SpO2 98 %.   General: [name] apparent distress alert and oriented x3 mood and affect normal, dressed appropriately.  HEENT: Pupils equal, extraocular movements intact  Respiratory: Patient's speak in full sentences and does not appear short of breath  Cardiovascular: [name] lower extremity edema, non tender, [name] erythema  Gait normal with good balance and coordination.  MSK: Right elbow exam shows the patient is tender to palpation over the lateral epicondylar region.  Patient does not have weakness but worsening pain with extension of the wrist.  Patient does have mild tenderness also noted over the lateral  epicondylitis area on the left side.  Limited muscular skeletal ultrasound was performed and interpreted by Hulan Saas, M  Limited ultrasound of patient's right elbow shows the patient does have increasing hypoechoic changes as well as increased neovascularization.  Patient does have what appears to be within retraction of the common extensor tendon at this moment. Impression: Interval worsening of the common extensor tendon tearing   Impression and Recommendations:     The above documentation has been reviewed and is accurate and complete Stacie Pulley, DO

## 2021-11-11 ENCOUNTER — Ambulatory Visit: Payer: No Typology Code available for payment source | Admitting: Family Medicine

## 2021-11-11 ENCOUNTER — Other Ambulatory Visit: Payer: Self-pay

## 2021-11-11 ENCOUNTER — Encounter: Payer: Self-pay | Admitting: Family Medicine

## 2021-11-11 ENCOUNTER — Ambulatory Visit: Payer: Self-pay

## 2021-11-11 VITALS — BP 128/82 | HR 64 | Ht 65.0 in | Wt 124.0 lb

## 2021-11-11 DIAGNOSIS — M7701 Medial epicondylitis, right elbow: Secondary | ICD-10-CM

## 2021-11-11 DIAGNOSIS — M79672 Pain in left foot: Secondary | ICD-10-CM

## 2021-11-11 NOTE — Patient Instructions (Addendum)
One min walk one min jog Increase by one min jog every week  Let's try PRP

## 2021-11-11 NOTE — Assessment & Plan Note (Signed)
Patient does have intersubstance tearing noted at this point.  Discussed with patient that I do feel that if anything is seems to be worsening.  With increasing in Doppler flow.  Patient has not been avoiding as much of the overhead activity to do it.  I do think patient would be a very good candidate though for PRP.  Patient is not a candidate for nitroglycerin patches secondary to history of migraines.  Patient will follow-up with me again 6 to 8 weeks otherwise

## 2021-11-20 ENCOUNTER — Other Ambulatory Visit (HOSPITAL_COMMUNITY): Payer: Self-pay

## 2021-11-20 MED ORDER — ROSUVASTATIN CALCIUM 5 MG PO TABS
5.0000 mg | ORAL_TABLET | Freq: Every day | ORAL | 1 refills | Status: DC
  Filled 2021-11-20: qty 90, 90d supply, fill #0
  Filled 2022-02-04: qty 90, 90d supply, fill #1

## 2021-11-20 NOTE — Progress Notes (Signed)
[place] Phone: [phone] Subjective:   Stacie [name], am serving as a scribe for Dr. Hulan Saas.  This visit occurred during the SARS-CoV-2 public health emergency.  Safety protocols were in place, including screening questions prior to the visit, additional usage of staff PPE, and extensive cleaning of exam room while observing appropriate contact time as indicated for disinfecting solutions.   I'm seeing this patient by the request  of:  Rankins, Stacie Salinas, MD  CC: elbow pain follow up   JME:[id number]  11/11/2021 Patient does have intersubstance tearing noted at this point.  Discussed with patient that I do feel that if anything is seems to be worsening.  With increasing in Doppler flow.  Patient has not been avoiding as much of the overhead activity to do it.  I do think patient would be a very good candidate though for PRP.  Patient is not a candidate for nitroglycerin patches secondary to history of migraines.  Patient will follow-up with me again 6 to 8 weeks otherwise  Updated 11/21/2021 Stacie [name] is a 61 y.o. female coming in with complaint of R elbow pain. Here for PRP. Elbow painful when sleeping and in the mornings. Pain over medial epicondyle.       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   Cancer [facility])    Past Surgical History:  Procedure Laterality Date   BREAST LUMPECTOMY Right 2011   DCIS   Social History   Socioeconomic History   Marital status: Single    Spouse name: Not on file   Number of children: Not on file   Years of education: Not on file   Highest education level: Not on file  Occupational History   Not on file  Tobacco Use   Smoking status: Never   Smokeless tobacco: Never  Substance and Sexual Activity   Alcohol use: Not on file   Drug use: Not on file   Sexual activity: Not on file  Other Topics Concern   Not on  file  Social History Narrative   Not on file   Social Determinants of Health   Financial Resource Strain: Not on file  Food Insecurity: Not on file  Transportation Needs: Not on file  Physical Activity: Not on file  Stress: Not on file  Social Connections: Not on file   Allergies  Allergen Reactions   Codeine Other (See Comments)    Hydrocodone and Oxycodone okay per patient hallucinations    Penicillins Hives and Rash   Oxycodone Nausea Only   [name] family history on file.   Current Outpatient Medications (Cardiovascular):    rosuvastatin (CRESTOR) 5 MG tablet, Take 5 mg by mouth daily.   rosuvastatin (CRESTOR) 5 MG tablet, Take 1 tablet (5 mg total) by mouth daily.   rosuvastatin (CRESTOR) 5 MG tablet, Take 1 tablet (5 mg total) by mouth daily.   rosuvastatin (CRESTOR) 5 MG tablet, TAKE 1 TABLET BY MOUTH ONCE A DAY  Current Outpatient Medications (Respiratory):    cetirizine (ZYRTEC) 10 MG tablet, Take 10 mg by mouth daily.   chlorpheniramine (CHLOR-TRIMETON) 4 MG tablet, Take 4 mg by mouth 2 (two) times daily as needed for allergies.  Current Outpatient Medications (Analgesics):    SUMAtriptan (IMITREX) 50 MG tablet, Take 50 mg by mouth as needed.    SUMAtriptan (IMITREX) 50 MG tablet, TAKE 1 TABLET BY MOUTH ONCE  A DAY AS NEEDED   Current Outpatient Medications (Other):    CALCIUM-VITAMIN D PO, Take 1 tablet by mouth 2 (two) times daily.   citalopram (CELEXA) 10 MG tablet, Take 1 tablet (10 mg total) by mouth daily.   citalopram (CELEXA) 10 MG tablet, Take 1 tablet (10 mg total) by mouth daily.   citalopram (CELEXA) 20 MG tablet, Take 1 tablet (20 mg total) by mouth daily.   Coenzyme Q10 (COQ10) 200 MG CAPS, Take 1 tablet by mouth daily.   COVID-19 At Home Antigen Test [facility] COVID-19 HOME TEST) KIT, use as directed   COVID-19 At Home Antigen Test (QUICKVUE AT-HOME COVID-19 TEST) KIT, Use as directed.   doxycycline (VIBRA-TABS) 100 MG tablet, Take 1 tablet (100 mg  total) by mouth 2 (two) times daily.   Multiple Vitamin (MULTI-VITAMINS) TABS, Take by mouth.   Omega-3 Fatty Acids (FISH OIL) 1000 MG CAPS, Take by mouth.   Turmeric Curcumin 500 MG CAPS, Take by mouth.   Melatonin 3 MG CAPS, Take 1 capsule by mouth daily.   Reviewed prior external information including notes and imaging from  primary care provider As well as notes that were available from care everywhere and other healthcare systems.  Past medical history, social, surgical and family history all reviewed in electronic medical record.  [name] pertanent information unless stated regarding to the chief complaint.   Review of Systems:  [name] headache, visual changes, nausea, vomiting, diarrhea, constipation, dizziness, abdominal pain, skin rash, fevers, chills, night sweats, weight loss, swollen lymph nodes, body aches, joint swelling, chest pain, shortness of breath, mood changes. POSITIVE muscle aches  Objective  Blood pressure 128/88, pulse 79, height $RemoveBe'5\' 5"'DAGpFzPjO$  (1.651 m), weight 126 lb (57.2 kg), SpO2 97 %.   General: [name] apparent distress alert and oriented x3 mood and affect normal, dressed appropriately.  HEENT: Pupils equal, extraocular movements intact  Respiratory: Patient's speak in full sentences and does not appear short of breath  Cardiovascular: [name] lower extremity edema, non tender, [name] erythema  Procedure: Real-time Ultrasound Guided Injection of right medial epicondylar area Device: GE Logiq Q7 Ultrasound guided injection is preferred based studies that show increased duration, increased effect, greater accuracy, decreased procedural pain, increased response rate, and decreased cost with ultrasound guided versus blind injection.  Verbal informed consent obtained.  Time-out conducted.  Noted [name] overlying erythema, induration, or other signs of local infection.  Skin prepped in a sterile fashion.  Local anesthesia: Topical Ethyl chloride.  With sterile technique and under real time  ultrasound guidance: Common flexor tendon visualized and then injected with 0.5 cc of 0.5% Marcaine.  Patient then was injected with 3 cc of PRP.  [name] blood loss.  Responded well to the injection. Completed without difficulty  Advised to call if fevers/chills, erythema, induration, drainage, or persistent bleeding.  Impression: Technically successful ultrasound guided injection.      Impression and Recommendations:     The above documentation has been reviewed and is accurate and complete Stacie Pulley, DO

## 2021-11-21 ENCOUNTER — Other Ambulatory Visit: Payer: Self-pay

## 2021-11-21 ENCOUNTER — Ambulatory Visit: Payer: Self-pay

## 2021-11-21 ENCOUNTER — Ambulatory Visit (INDEPENDENT_AMBULATORY_CARE_PROVIDER_SITE_OTHER): Payer: Self-pay | Admitting: Family Medicine

## 2021-11-21 ENCOUNTER — Other Ambulatory Visit (HOSPITAL_COMMUNITY): Payer: Self-pay

## 2021-11-21 VITALS — BP 128/88 | HR 79 | Ht 65.0 in | Wt 126.0 lb

## 2021-11-21 DIAGNOSIS — M7701 Medial epicondylitis, right elbow: Secondary | ICD-10-CM

## 2021-11-21 DIAGNOSIS — M25521 Pain in right elbow: Secondary | ICD-10-CM

## 2021-11-21 MED ORDER — DOXYCYCLINE HYCLATE 100 MG PO TABS
100.0000 mg | ORAL_TABLET | Freq: Two times a day (BID) | ORAL | 0 refills | Status: AC
  Filled 2021-11-21: qty 20, 10d supply, fill #0

## 2021-11-21 NOTE — Patient Instructions (Signed)
No ice or IBU for 3 days Heat and Tylenol are ok See you again in 4-5 weeks

## 2021-11-23 ENCOUNTER — Encounter: Payer: Self-pay | Admitting: Family Medicine

## 2021-11-23 NOTE — Assessment & Plan Note (Signed)
PRP done, was able to tolerate well.  Discussed possible need for abx if signs and symptoms occur  Post prp injection schedule and HEP given.  RTC in 5-6 weeks to see how it responds

## 2021-12-23 NOTE — Progress Notes (Signed)
?Charlann Boxer D.O. ?[facility] ?[place] ?Phone: [phone] ?Subjective:   ?I, Vilma Meckel, am serving as a Education administrator for Dr. Hulan Saas. ?This visit occurred during the SARS-CoV-2 public health emergency.  Safety protocols were in place, including screening questions prior to the visit, additional usage of staff PPE, and extensive cleaning of exam room while observing appropriate contact time as indicated for disinfecting solutions.  ? ?I'm seeing this patient by the request  of:  Rankins, Bill Salinas, MD ? ?CC: Right elbow pain ? ?WIO:[id number]  ?11/21/2021 ?PRP done, was able to tolerate well.  ?Discussed possible need for abx if signs and symptoms occur  ?Post prp injection schedule and HEP given.  ?RTC in 5-6 weeks to see how it responds  ? ?Update 12/24/2021 ?Stacie [name] is a 61 y.o. female coming in with complaint of R elbow pain.  Found to have medial epicondylitis.  Patient was given PRP on February 24.  Was to follow-up post PRP scheduled.  Patient states doing much better, but haven't pushed it. Doing the exercises. Wants to know the plan for safely going back into pickleball and yoga. ? ? ?  ? ?Past Medical History:  ?Diagnosis Date  ? Breast cancer [facility]) 2011  ? DCIS  ? Cancer [facility])   ? ?Past Surgical History:  ?Procedure Laterality Date  ? BREAST LUMPECTOMY Right 2011  ? DCIS  ? ?Social History  ? ?Socioeconomic History  ? Marital status: Single  ?  Spouse name: Not on file  ? Number of children: Not on file  ? Years of education: Not on file  ? Highest education level: Not on file  ?Occupational History  ? Not on file  ?Tobacco Use  ? Smoking status: Never  ? Smokeless tobacco: Never  ?Substance and Sexual Activity  ? Alcohol use: Not on file  ? Drug use: Not on file  ? Sexual activity: Not on file  ?Other Topics Concern  ? Not on file  ?Social History Narrative  ? Not on file  ? ?Social Determinants of Health  ? ?Financial Resource Strain: Not on  file  ?Food Insecurity: Not on file  ?Transportation Needs: Not on file  ?Physical Activity: Not on file  ?Stress: Not on file  ?Social Connections: Not on file  ? ?Allergies  ?Allergen Reactions  ? Codeine Other (See Comments)  ?  Hydrocodone and Oxycodone okay per patient ?hallucinations ?  ? Penicillins Hives and Rash  ? Oxycodone Nausea Only  ? ?No family history on file. ? ? ?Current Outpatient Medications (Cardiovascular):  ?  rosuvastatin (CRESTOR) 5 MG tablet, Take 5 mg by mouth daily. ?  rosuvastatin (CRESTOR) 5 MG tablet, TAKE 1 TABLET BY MOUTH ONCE A DAY ?  rosuvastatin (CRESTOR) 5 MG tablet, Take 1 tablet (5 mg total) by mouth daily. ?  rosuvastatin (CRESTOR) 5 MG tablet, Take 1 tablet (5 mg total) by mouth daily. ? ?Current Outpatient Medications (Respiratory):  ?  cetirizine (ZYRTEC) 10 MG tablet, Take 10 mg by mouth daily. ?  chlorpheniramine (CHLOR-TRIMETON) 4 MG tablet, Take 4 mg by mouth 2 (two) times daily as needed for allergies. ? ?Current Outpatient Medications (Analgesics):  ?  SUMAtriptan (IMITREX) 50 MG tablet, Take 50 mg by mouth as needed.  ?  SUMAtriptan (IMITREX) 50 MG tablet, TAKE 1 TABLET BY MOUTH ONCE A DAY AS NEEDED ? ? ?Current Outpatient Medications (Other):  ?  CALCIUM-VITAMIN D PO, Take 1 tablet by mouth  2 (two) times daily. ?  citalopram (CELEXA) 10 MG tablet, Take 1 tablet (10 mg total) by mouth daily. ?  citalopram (CELEXA) 10 MG tablet, Take 1 tablet (10 mg total) by mouth daily. ?  citalopram (CELEXA) 20 MG tablet, Take 1 tablet (20 mg total) by mouth daily. ?  Coenzyme Q10 (COQ10) 200 MG CAPS, Take 1 tablet by mouth daily. ?  COVID-19 At Home Antigen Test [facility] COVID-19 HOME TEST) KIT, use as directed ?  COVID-19 At Home Antigen Test [facility] AT-HOME COVID-19 TEST) KIT, Use as directed. ?  doxycycline (VIBRA-TABS) 100 MG tablet, Take 1 tablet (100 mg total) by mouth 2 (two) times daily. ?  Melatonin 3 MG CAPS, Take 1 capsule by mouth daily. ?  Multiple Vitamin  (MULTI-VITAMINS) TABS, Take by mouth. ?  Omega-3 Fatty Acids (FISH OIL) 1000 MG CAPS, Take by mouth. ?  Turmeric Curcumin 500 MG CAPS, Take by mouth. ? ? ?Reviewed prior external information including notes and imaging from  ?primary care provider ?As well as notes that were available from care everywhere and other healthcare systems. ? ?Past medical history, social, surgical and family history all reviewed in electronic medical record.  No pertanent information unless stated regarding to the chief complaint.  ? ?Review of Systems: ? No headache, visual changes, nausea, vomiting, diarrhea, constipation, dizziness, abdominal pain, skin rash, fevers, chills, night sweats, weight loss, swollen lymph nodes, body aches, joint swelling, chest pain, shortness of breath, mood changes. POSITIVE muscle aches ? ?Objective  ?Blood pressure 126/88, pulse 78, height $RemoveBe'5\' 5"'ywzaVjdgY$  (1.651 m), weight 124 lb (56.2 kg), SpO2 98 %. ?  ?General: No apparent distress alert and oriented x3 mood and affect normal, dressed appropriately.  ?HEENT: Pupils equal, extraocular movements intact  ?Respiratory: Patient's speak in full sentences and does not appear short of breath  ?Cardiovascular: No lower extremity edema, non tender, no erythema  ?Gait normal with good balance and coordination.  ?MSK: Right elbow exam shows the patient continues to palpation over the medial epicondylar area.  Less than previously.  Good grip strength but does have pain with resisted extension of the wrist. ? ? ?Limited muscular skeletal ultrasound was performed and interpreted by Hulan Saas, M  ?Limited ultrasound of patient's common extensor tendon does have some mild scar tissue formation noted.  Does seem to be somewhat improved from previous exam does also have some mild hypoechoic changes of the tendon noted.  Patient though does have increasing neovascularization. ?Impression: Interval improvement but delayed ?  ?Impression and Recommendations:  ?  ? ?The above  documentation has been reviewed and is accurate and complete Lyndal Pulley, DO ? ? ? ?

## 2021-12-24 ENCOUNTER — Ambulatory Visit: Payer: No Typology Code available for payment source | Admitting: Family Medicine

## 2021-12-24 ENCOUNTER — Encounter: Payer: Self-pay | Admitting: Family Medicine

## 2021-12-24 ENCOUNTER — Ambulatory Visit: Payer: Self-pay

## 2021-12-24 VITALS — BP 126/88 | HR 78 | Ht 65.0 in | Wt 124.0 lb

## 2021-12-24 DIAGNOSIS — M7701 Medial epicondylitis, right elbow: Secondary | ICD-10-CM

## 2021-12-24 NOTE — Patient Instructions (Signed)
Looking good but 45% better ?Yoga in 2 weeks ?Pickleball with friends 4 weeks ?See you again in 8 weeks ?

## 2021-12-24 NOTE — Assessment & Plan Note (Signed)
Patient has made some improvement but still interval healing still noted.  The patient is having more on the medial and lateral aspect and we will need to continue to monitor. ?We discussed icing regimen and home exercises.  Discussed which activities to do which ones to avoid, discussed proper positioning.  Follow-up again in 6 to 8 weeks ?

## 2022-01-02 ENCOUNTER — Other Ambulatory Visit: Payer: Self-pay | Admitting: Family Medicine

## 2022-01-02 DIAGNOSIS — Z1231 Encounter for screening mammogram for malignant neoplasm of breast: Secondary | ICD-10-CM

## 2022-01-26 ENCOUNTER — Ambulatory Visit
Admission: RE | Admit: 2022-01-26 | Discharge: 2022-01-26 | Disposition: A | Discharge status: Home / Self Care | Payer: No Typology Code available for payment source | Source: Ambulatory Visit | Attending: Family Medicine | Admitting: Family Medicine

## 2022-01-26 DIAGNOSIS — Z1231 Encounter for screening mammogram for malignant neoplasm of breast: Secondary | ICD-10-CM

## 2022-02-04 ENCOUNTER — Other Ambulatory Visit (HOSPITAL_COMMUNITY): Payer: Self-pay

## 2022-02-17 NOTE — Progress Notes (Unsigned)
[place] Phone: [phone] Subjective:   Stacie [name], am serving as a scribe for Dr. Hulan Saas.   I'm seeing this patient by the request  of:  Rankins, Bill Salinas, MD  CC: Right elbow pain  KPT:[id number]  12/24/2021 Patient has made some improvement but still interval healing still noted.  The patient is having more on the medial and lateral aspect and we will need to continue to monitor. We discussed icing regimen and home exercises.  Discussed which activities to do which ones to avoid, discussed proper positioning.  Follow-up again in 6 to 8 weeks  Update 02/18/2022 Stacie [name] is a 61 y.o. female coming in with complaint of R elbow pain. Patient states that her elbow is not improving. Has not been playing or doing HEP as she was not improving since last visit. Pain continues over medial epicondyle.      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   Cancer [facility])    Past Surgical History:  Procedure Laterality Date   BREAST LUMPECTOMY Right 2011   DCIS   Social History   Socioeconomic History   Marital status: Single    Spouse name: Not on file   Number of children: Not on file   Years of education: Not on file   Highest education level: Not on file  Occupational History   Not on file  Tobacco Use   Smoking status: Never   Smokeless tobacco: Never  Substance and Sexual Activity   Alcohol use: Not on file   Drug use: Not on file   Sexual activity: Not on file  Other Topics Concern   Not on file  Social History Narrative   Not on file   Social Determinants of Health   Financial Resource Strain: Not on file  Food Insecurity: Not on file  Transportation Needs: Not on file  Physical Activity: Not on file  Stress: Not on file  Social Connections: Not on file   Allergies  Allergen Reactions   Codeine Other (See Comments)    Hydrocodone and  Oxycodone okay per patient hallucinations    Penicillins Hives and Rash   Oxycodone Nausea Only   [name] family history on file.   Current Outpatient Medications (Cardiovascular):    rosuvastatin (CRESTOR) 5 MG tablet, Take 1 tablet (5 mg total) by mouth daily.   rosuvastatin (CRESTOR) 5 MG tablet, Take 5 mg by mouth daily.   rosuvastatin (CRESTOR) 5 MG tablet, TAKE 1 TABLET BY MOUTH ONCE A DAY   rosuvastatin (CRESTOR) 5 MG tablet, Take 1 tablet (5 mg total) by mouth daily.  Current Outpatient Medications (Respiratory):    cetirizine (ZYRTEC) 10 MG tablet, Take 10 mg by mouth daily.   chlorpheniramine (CHLOR-TRIMETON) 4 MG tablet, Take 4 mg by mouth 2 (two) times daily as needed for allergies.  Current Outpatient Medications (Analgesics):    SUMAtriptan (IMITREX) 50 MG tablet, Take 50 mg by mouth as needed.    SUMAtriptan (IMITREX) 50 MG tablet, TAKE 1 TABLET BY MOUTH ONCE A DAY AS NEEDED   Current Outpatient Medications (Other):    CALCIUM-VITAMIN D PO, Take 1 tablet by mouth 2 (two) times daily.   citalopram (CELEXA) 10 MG tablet, Take 1 tablet (10 mg total) by mouth daily.   Coenzyme Q10 (COQ10) 200 MG CAPS, Take 1 tablet by mouth daily.   Multiple Vitamin (MULTI-VITAMINS) TABS,  Take by mouth.   Omega-3 Fatty Acids (FISH OIL) 1000 MG CAPS, Take by mouth.   Turmeric Curcumin 500 MG CAPS, Take by mouth.   citalopram (CELEXA) 10 MG tablet, Take 1 tablet (10 mg total) by mouth daily.   citalopram (CELEXA) 20 MG tablet, Take 1 tablet (20 mg total) by mouth daily.   COVID-19 At Home Antigen Test [facility] COVID-19 HOME TEST) KIT, use as directed   COVID-19 At Home Antigen Test (QUICKVUE AT-HOME COVID-19 TEST) KIT, Use as directed.   doxycycline (VIBRA-TABS) 100 MG tablet, Take 1 tablet (100 mg total) by mouth 2 (two) times daily.   Melatonin 3 MG CAPS, Take 1 capsule by mouth daily.   Reviewed prior external information including notes and imaging from  primary care provider As  well as notes that were available from care everywhere and other healthcare systems.  Past medical history, social, surgical and family history all reviewed in electronic medical record.  [name] pertanent information unless stated regarding to the chief complaint.   Review of Systems:  [name] headache, visual changes, nausea, vomiting, diarrhea, constipation, dizziness, abdominal pain, skin rash, fevers, chills, night sweats, weight loss, swollen lymph nodes, body aches, joint swelling, chest pain, shortness of breath, mood changes. POSITIVE muscle aches  Objective  Blood pressure 118/86, pulse 78, height 5' 5" (1.651 m), weight 125 lb (56.7 kg), SpO2 97 %.   General: [name] apparent distress alert and oriented x3 mood and affect normal, dressed appropriately.  HEENT: Pupils equal, extraocular movements intact  Respiratory: Patient's speak in full sentences and does not appear short of breath  Cardiovascular: [name] lower extremity edema, non tender, [name] erythema  Gait normal with good balance and coordination.  MSK: Swelling.  Medial elbow and does have some tenderness to palpation over the medial condyle area.  Negative Tinel's though noted.  Good grip strength noted.  [name]   Limited muscular skeletal ultrasound was performed and interpreted by Hulan Saas, M  Limited ultrasound of patient's right elbow on the medial epicondylar area shows the patient still has some hypoechoic changes noted.  [name] true tearing and does seem to have significant more scar tissue formation with hyperechoic changes noted.  This does not seem to have progressed significantly.  At this point though there is [name] interstitial tearing noted. Impression: Interval improvement but continued to have scar tissue   Impression and Recommendations:     The above documentation has been reviewed and is accurate and complete Lyndal Pulley, DO

## 2022-02-18 ENCOUNTER — Encounter: Payer: Self-pay | Admitting: Family Medicine

## 2022-02-18 ENCOUNTER — Ambulatory Visit: Payer: Self-pay

## 2022-02-18 ENCOUNTER — Ambulatory Visit: Payer: No Typology Code available for payment source | Admitting: Family Medicine

## 2022-02-18 VITALS — BP 118/86 | HR 78 | Ht 65.0 in | Wt 125.0 lb

## 2022-02-18 DIAGNOSIS — M7701 Medial epicondylitis, right elbow: Secondary | ICD-10-CM | POA: Diagnosis not present

## 2022-02-18 DIAGNOSIS — M25521 Pain in right elbow: Secondary | ICD-10-CM

## 2022-02-18 NOTE — Assessment & Plan Note (Signed)
I believe that this did help with the interstitial tearing but unfortunately patient continues to have some tightness and scar tissue in the area.  Discussed with patient that at this moment I would like patient to start to increase activity and see how patient responds.  If patient has worsening symptoms I do think an MRI is necessary and would do an MR arthrogram to further evaluate the U[facility] but I think is highly likely patient will improve.
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
## 2022-05-05 ENCOUNTER — Other Ambulatory Visit (HOSPITAL_COMMUNITY): Payer: Self-pay

## 2022-05-05 MED ORDER — CITALOPRAM HYDROBROMIDE 10 MG PO TABS
10.0000 mg | ORAL_TABLET | Freq: Every day | ORAL | 0 refills | Status: DC
  Filled 2022-05-05: qty 90, 90d supply, fill #0

## 2022-05-06 ENCOUNTER — Other Ambulatory Visit (HOSPITAL_COMMUNITY): Payer: Self-pay

## 2022-05-06 MED ORDER — ROSUVASTATIN CALCIUM 5 MG PO TABS
5.0000 mg | ORAL_TABLET | Freq: Every day | ORAL | 0 refills | Status: DC
  Filled 2022-05-06: qty 90, 90d supply, fill #0

## 2022-06-09 ENCOUNTER — Other Ambulatory Visit (HOSPITAL_COMMUNITY): Payer: Self-pay

## 2022-06-09 ENCOUNTER — Ambulatory Visit: Payer: No Typology Code available for payment source | Admitting: Family Medicine

## 2022-06-09 ENCOUNTER — Encounter: Payer: Self-pay | Admitting: Family Medicine

## 2022-06-09 ENCOUNTER — Ambulatory Visit (INDEPENDENT_AMBULATORY_CARE_PROVIDER_SITE_OTHER): Payer: No Typology Code available for payment source

## 2022-06-09 VITALS — BP 138/90 | HR 72 | Ht 65.0 in | Wt 125.0 lb

## 2022-06-09 DIAGNOSIS — M545 Low back pain, unspecified: Secondary | ICD-10-CM | POA: Diagnosis not present

## 2022-06-09 DIAGNOSIS — M62830 Muscle spasm of back: Secondary | ICD-10-CM | POA: Insufficient documentation

## 2022-06-09 MED ORDER — KETOROLAC TROMETHAMINE 60 MG/2ML IM SOLN
60.0000 mg | Freq: Once | INTRAMUSCULAR | Status: AC
  Administered 2022-06-09: 60 mg via INTRAMUSCULAR

## 2022-06-09 MED ORDER — TIZANIDINE HCL 2 MG PO TABS
2.0000 mg | ORAL_TABLET | Freq: Every day | ORAL | 0 refills | Status: AC
  Filled 2022-06-09: qty 30, 30d supply, fill #0

## 2022-06-09 MED ORDER — METHYLPREDNISOLONE ACETATE 80 MG/ML IJ SUSP
80.0000 mg | Freq: Once | INTRAMUSCULAR | Status: AC
  Administered 2022-06-09: 80 mg via INTRAMUSCULAR

## 2022-06-09 NOTE — Patient Instructions (Addendum)
Xray today Zanaflex '2mg'$  at night Exercises See me in 4-5 weeks

## 2022-06-09 NOTE — Assessment & Plan Note (Signed)
Patient does have more of a lumbar muscle spasm.  Discussed with patient about icing regimen and home exercises.  Discussed with patient about and Zanaflex.  Toradol and Depo-Medrol given today to help with some of the discomfort and pain as well.  No radicular symptoms but patient did have worsening pain with straight leg test compared to sacroiliac joint pain.  Can consider the possibility of osteopathic manipulation and follow-up.  Follow-up again in 6 to 8 weeks otherwise.

## 2022-06-09 NOTE — Progress Notes (Signed)
[place] Phone: [phone] Subjective:   [name], am serving as a scribe for Dr. Hulan Saas.  I'm seeing this patient by the request  of:  Stacie Congress, NP  CC: low back pain   DEY:[id number]  Stacie [name] is a 61 y.o. female coming in with complaint of LBP. Patient states that she moved stuff around in her condo 2 weeks ago. Had not been performing her usual exercises over summer. Played pickleball yesterday as she thought she was better and pain came back. Sharp pain in center of lumbar spine. When she takes a step with R leg her pain increases. Used IBU yesterday.    PMHx breast cancer.      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   Cancer [facility])    Past Surgical History:  Procedure Laterality Date   BREAST LUMPECTOMY Right 2011   DCIS   Social History   Socioeconomic History   Marital status: Single    Spouse name: Not on file   Number of children: Not on file   Years of education: Not on file   Highest education level: Not on file  Occupational History   Not on file  Tobacco Use   Smoking status: Never   Smokeless tobacco: Never  Substance and Sexual Activity   Alcohol use: Not on file   Drug use: Not on file   Sexual activity: Not on file  Other Topics Concern   Not on file  Social History Narrative   Not on file   Social Determinants of Health   Financial Resource Strain: Not on file  Food Insecurity: Not on file  Transportation Needs: Not on file  Physical Activity: Not on file  Stress: Not on file  Social Connections: Not on file   Allergies  Allergen Reactions   Codeine Other (See Comments)    Hydrocodone and Oxycodone okay per patient hallucinations    Penicillins Hives and Rash   Oxycodone Nausea Only   No family history on file.   Current Outpatient Medications (Cardiovascular):    rosuvastatin  (CRESTOR) 5 MG tablet, Take 1 tablet (5 mg total) by mouth daily.   rosuvastatin (CRESTOR) 5 MG tablet, Take 5 mg by mouth daily.   rosuvastatin (CRESTOR) 5 MG tablet, TAKE 1 TABLET BY MOUTH ONCE A DAY   rosuvastatin (CRESTOR) 5 MG tablet, Take 1 tablet (5 mg total) by mouth daily.  Current Outpatient Medications (Respiratory):    cetirizine (ZYRTEC) 10 MG tablet, Take 10 mg by mouth daily.   chlorpheniramine (CHLOR-TRIMETON) 4 MG tablet, Take 4 mg by mouth 2 (two) times daily as needed for allergies.  Current Outpatient Medications (Analgesics):    SUMAtriptan (IMITREX) 50 MG tablet, Take 50 mg by mouth as needed.    SUMAtriptan (IMITREX) 50 MG tablet, TAKE 1 TABLET BY MOUTH ONCE A DAY AS NEEDED   Current Outpatient Medications (Other):    CALCIUM-VITAMIN D PO, Take 1 tablet by mouth 2 (two) times daily.   citalopram (CELEXA) 10 MG tablet, Take 1 tablet (10 mg total) by mouth daily.   citalopram (CELEXA) 10 MG tablet, Take 1 tablet (10 mg total) by mouth daily.   Coenzyme Q10 (COQ10) 200 MG CAPS, Take 1 tablet by mouth daily.   Multiple Vitamin (MULTI-VITAMINS) TABS, Take by mouth.   Omega-3 Fatty Acids (FISH OIL) 1000 MG CAPS, Take by mouth.  tiZANidine (ZANAFLEX) 2 MG tablet, Take 1 tablet (2 mg total) by mouth at bedtime.   Turmeric Curcumin 500 MG CAPS, Take by mouth.   citalopram (CELEXA) 10 MG tablet, Take 1 tablet (10 mg total) by mouth daily.   citalopram (CELEXA) 20 MG tablet, Take 1 tablet (20 mg total) by mouth daily.   COVID-19 At Home Antigen Test [facility] COVID-19 HOME TEST) KIT, use as directed   COVID-19 At Home Antigen Test (QUICKVUE AT-HOME COVID-19 TEST) KIT, Use as directed.   doxycycline (VIBRA-TABS) 100 MG tablet, Take 1 tablet (100 mg total) by mouth 2 (two) times daily.   Melatonin 3 MG CAPS, Take 1 capsule by mouth daily.   Reviewed prior external information including notes and imaging from  primary care provider As well as notes that were available from  care everywhere and other healthcare systems.  Past medical history, social, surgical and family history all reviewed in electronic medical record.  No pertanent information unless stated regarding to the chief complaint.   Review of Systems:  No headache, visual changes, nausea, vomiting, diarrhea, constipation, dizziness, abdominal pain, skin rash, fevers, chills, night sweats, weight loss, swollen lymph nodes, body aches, joint swelling, chest pain, shortness of breath, mood changes. POSITIVE muscle aches  Objective  Blood pressure (!) 138/90, pulse 72, height $RemoveBe'5\' 5"'GOBZYhtGP$  (1.651 m), weight 125 lb (56.7 kg), SpO2 98 %.   General: No apparent distress alert and oriented x3 mood and affect normal, dressed appropriately.  HEENT: Pupils equal, extraocular movements intact  Respiratory: Patient's speak in full sentences and does not appear short of breath  Cardiovascular: No lower extremity edema, non tender, no erythema  Low back exam shows patient does have significant loss of lordosis.  Tightness with straight leg test bilaterally.  Some mild worsening pain with flexion and extension of the back.  No difficulty with rotation with voluntary guarding.  No midline tenderness.  More tenderness noted over the right sacroiliac joint 5 out of 5 strength of the lower extremity and symmetric     Impression and Recommendations:    The above documentation has been reviewed and is accurate and complete Lyndal Pulley, DO

## 2022-06-19 ENCOUNTER — Telehealth: Payer: Self-pay | Admitting: Family Medicine

## 2022-06-19 ENCOUNTER — Other Ambulatory Visit: Payer: Self-pay

## 2022-06-19 ENCOUNTER — Other Ambulatory Visit (HOSPITAL_COMMUNITY): Payer: Self-pay

## 2022-06-19 MED ORDER — PREDNISONE 50 MG PO TABS
50.0000 mg | ORAL_TABLET | Freq: Every day | ORAL | 0 refills | Status: AC
  Filled 2022-06-19: qty 5, 5d supply, fill #0

## 2022-06-19 NOTE — Telephone Encounter (Signed)
Seen about 2 weeks ago for back pain. Has been doing exercises and feeling better but the last 24 hours has seen an increase in pain. Xanaflex not really helping and she is starting to doubt if this is muscular.  The only difference in physicality over the past 24 hours was a lot of standing without breaks yesterday.  Looking for advice.

## 2022-06-19 NOTE — Telephone Encounter (Signed)
Sorry to hear this, if worsening pain can send in prednisone if she would like.  New or worsening discomfort, any nausea or vomiting, in the other significant symptoms need to go to the emergency room this weekend.

## 2022-06-19 NOTE — Telephone Encounter (Signed)
Spoke with patient and she would like to proceed with Rx. Rx called in per a verbal from Dr. Tamala Julian.

## 2022-06-22 ENCOUNTER — Encounter: Payer: Self-pay | Admitting: Family Medicine

## 2022-07-06 NOTE — Progress Notes (Unsigned)
[place] Phone: [phone] Subjective:   [name], am serving as a scribe for Dr. Hulan Saas.  I'm seeing this patient by the request  of:  Faustino Congress, NP  CC: Low back foot pain  LMB:[id number]  06/09/2022 Patient does have more of a lumbar muscle spasm.  Discussed with patient about icing regimen and home exercises.  Discussed with patient about and Zanaflex.  Toradol and Depo-Medrol given today to help with some of the discomfort and pain as well.  No radicular symptoms but patient did have worsening pain with straight leg test compared to sacroiliac joint pain.  Can consider the possibility of osteopathic manipulation and follow-up.  Follow-up again in 6 to 8 weeks otherwise.  Update 07/07/2022 Stacie [name] is a 61 y.o. female coming in with complaint of lumbar spine pain. Patient states that she has been doing stretches daily. Has seen improvement but does still have pain in L lumbar spine. As the day progresses, more muscles will tighten in lower back. Unable to make it through work. Has not been as active and wants to know what she can and cannot do.   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 Pain worsens after a lot of activity and if she sits for a while. Pain will be sharp.       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   Cancer [facility])    Past Surgical History:  Procedure Laterality Date   BREAST LUMPECTOMY Right 2011   DCIS   Social History   Socioeconomic History   Marital status: Single    Spouse name: Not on file   Number of children: Not on file   Years of education: Not on file   Highest education level: Not on file  Occupational History   Not on file  Tobacco Use   Smoking status: Never   Smokeless tobacco: Never  Substance and Sexual Activity   Alcohol use: Not on file   Drug use: Not on file   Sexual  activity: Not on file  Other Topics Concern   Not on file  Social History Narrative   Not on file   Social Determinants of Health   Financial Resource Strain: Not on file  Food Insecurity: Not on file  Transportation Needs: Not on file  Physical Activity: Not on file  Stress: Not on file  Social Connections: Not on file   Allergies  Allergen Reactions   Codeine Other (See Comments)    Hydrocodone and Oxycodone okay per patient hallucinations    Penicillins Hives and Rash   Oxycodone Nausea Only   No family history on file.   Current Outpatient Medications (Cardiovascular):    rosuvastatin (CRESTOR) 5 MG tablet, Take 1 tablet (5 mg total) by mouth daily.   rosuvastatin (CRESTOR) 5 MG tablet, Take 5 mg by mouth daily.   rosuvastatin (CRESTOR) 5 MG tablet, TAKE 1 TABLET BY MOUTH ONCE A DAY   rosuvastatin (CRESTOR) 5 MG tablet, Take 1 tablet (5 mg total) by mouth daily.  Current Outpatient Medications (Respiratory):    cetirizine (ZYRTEC) 10 MG tablet, Take 10 mg by mouth daily.   chlorpheniramine (CHLOR-TRIMETON) 4 MG tablet, Take 4 mg by mouth 2 (two) times daily as needed for allergies.  Current Outpatient Medications (Analgesics):    SUMAtriptan (IMITREX) 50 MG tablet, Take 50 mg by mouth as  needed.    SUMAtriptan (IMITREX) 50 MG tablet, TAKE 1 TABLET BY MOUTH ONCE A DAY AS NEEDED   Current Outpatient Medications (Other):    CALCIUM-VITAMIN D PO, Take 1 tablet by mouth 2 (two) times daily.   citalopram (CELEXA) 10 MG tablet, Take 1 tablet (10 mg total) by mouth daily.   citalopram (CELEXA) 10 MG tablet, Take 1 tablet (10 mg total) by mouth daily.   Coenzyme Q10 (COQ10) 200 MG CAPS, Take 1 tablet by mouth daily.   Multiple Vitamin (MULTI-VITAMINS) TABS, Take by mouth.   Omega-3 Fatty Acids (FISH OIL) 1000 MG CAPS, Take by mouth.   tiZANidine (ZANAFLEX) 2 MG tablet, Take 1 tablet (2 mg total) by mouth at bedtime.   Turmeric Curcumin 500 MG CAPS, Take by mouth.    citalopram (CELEXA) 10 MG tablet, Take 1 tablet (10 mg total) by mouth daily.   citalopram (CELEXA) 20 MG tablet, Take 1 tablet (20 mg total) by mouth daily.   COVID-19 At Home Antigen Test [facility] COVID-19 HOME TEST) KIT, use as directed   COVID-19 At Home Antigen Test (QUICKVUE AT-HOME COVID-19 TEST) KIT, Use as directed.   doxycycline (VIBRA-TABS) 100 MG tablet, Take 1 tablet (100 mg total) by mouth 2 (two) times daily.   Melatonin 3 MG CAPS, Take 1 capsule by mouth daily.   Reviewed prior external information including notes and imaging from  primary care provider As well as notes that were available from care everywhere and other healthcare systems.  Past medical history, social, surgical and family history all reviewed in electronic medical record.  No pertanent information unless stated regarding to the chief complaint.   Review of Systems:  No headache, visual changes, nausea, vomiting, diarrhea, constipation, dizziness, abdominal pain, skin rash, fevers, chills, night sweats, weight loss, swollen lymph nodes, body aches, joint swelling, chest pain, shortness of breath, mood changes. POSITIVE muscle aches  Objective  Blood pressure 132/80, pulse 88, height 5' 5" (1.651 m), weight 127 lb (57.6 kg), SpO2 99 %.   General: No apparent distress alert and oriented x3 mood and affect normal, dressed appropriately.  HEENT: Pupils equal, extraocular movements intact  Respiratory: Patient's speak in full sentences and does not appear short of breath  Cardiovascular: No lower extremity edema, non tender, no erythema  Low back exam does have some loss of lordosis.  Some tenderness to palpation noted.  Patient does have tightness with Corky Sox left greater than right.  Patient does have positive straight leg test on the left side though as well at 25 degrees of forward flexion with some radicular symptoms and involuntary guarding noted.  Tender to palpation in the L5-S1 region on the left  side.  Foot exam shows breakdown of the transverse arch noted.  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  No significant swelling noted.  Bunion and bunionette formation noted.  Morton's toe noted.    Impression and Recommendations:

## 2022-07-07 ENCOUNTER — Ambulatory Visit: Payer: No Typology Code available for payment source | Admitting: Family Medicine

## 2022-07-07 ENCOUNTER — Encounter: Payer: Self-pay | Admitting: Family Medicine

## 2022-07-07 VITALS — BP 132/80 | HR 88 | Ht 65.0 in | Wt 127.0 lb

## 2022-07-07 DIAGNOSIS — M545 Low back pain, unspecified: Secondary | ICD-10-CM

## 2022-07-07 DIAGNOSIS — M216X2 Other acquired deformities of left foot: Secondary | ICD-10-CM

## 2022-07-07 DIAGNOSIS — M62830 Muscle spasm of back: Secondary | ICD-10-CM

## 2022-07-07 NOTE — Assessment & Plan Note (Signed)
Continues to have a spasm.  Does have some radicular symptoms that we will continue to monitor.  X-rays were fairly unremarkable.  Does have a history of breast cancer though previously.  We did discussed if any worsening symptoms I would like to consider the possibility of a MRI.  Patient would like to start with formal physical therapy referred today.  Discussed with patient to continue the Zanaflex 2 mg up to twice a day follow-up with me again in 6 to 8 weeks.

## 2022-07-07 NOTE — Assessment & Plan Note (Signed)
Continue breakdown, home exercises given, discussed proper shoes, but continuing to have trouble will need to consider the possibility of custom orthotics.  Follow-up again in 6 to 8 weeks

## 2022-07-07 NOTE — Patient Instructions (Addendum)
[employer] Total Support Orthotics HOKA or OOFOS recovery sandals Exercises for arch PT [employer] Can consider custom orthotics See me again in 7-8 weeks

## 2022-07-14 NOTE — Therapy (Signed)
OUTPATIENT PHYSICAL THERAPY THORACOLUMBAR EVALUATION   Patient Name: Stacie [name] MRN: [id number] DOB:[birth year], 61 y.o., female Today's Date: 07/15/2022   PT End of Session - 07/15/22 1226     Visit Number 1    Number of Visits 13    Date for PT Re-Evaluation 08/29/22    Authorization Type [facility] [employer]    PT Start Time 1230    PT Stop Time 1315    PT Time Calculation (min) 45 min    Activity Tolerance Patient tolerated treatment well    Behavior During Therapy [facility] for tasks assessed/performed             Past Medical History:  Diagnosis Date   Breast cancer ([place]) 2011   DCIS   Cancer [facility])    Past Surgical History:  Procedure Laterality Date   BREAST LUMPECTOMY Right 2011   DCIS   Patient Active Problem List   Diagnosis Date Noted   Lumbar paraspinal muscle spasm 06/09/2022   Medial epicondylitis of elbow, right 10/07/2021   Loss of transverse plantar arch of left foot 10/07/2021   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   Depression 01/11/2014   Essential hypertension 01/11/2014   Migraine 01/11/2014   Mixed hyperlipidemia 01/11/2014   NASH (nonalcoholic steatohepatitis) 01/11/2014   Sleep disturbance 01/11/2014    PCP:   Faustino Congress, NP    REFERRING PROVIDER: Lyndal Pulley, DO  REFERRING DIAG:  Diagnosis  [phone] (ICD-10-CM) - Lumbar paraspinal muscle spasm  M54.50 (ICD-10-CM) - Lumbar spine pain    Rationale for Evaluation and Treatment Rehabilitation  THERAPY DIAG:  Other low back pain  Muscle weakness (generalized)  ONSET DATE: September 2023  SUBJECTIVE:                                                                                                                                                                                           SUBJECTIVE STATEMENT: Patient reports having to move items/furniture in her townhouse in August and started to notice back pain following this. She started to work back  into her exercise routine (rowing, yoga) and play pickle ball, but then during one incident of playing pickle ball she felt her back pain worsen in September. She recalls reaching for the ball and had an increase in pain. This pain was similar to the original pain she experienced in August, but worse. She report the pain has improved since September, but she has one spot of back pain that remains constant. She reports the constant pain is about the Lt side of the low back, but during the course of her workday that  pain begins to radiate along the entire low back. She denies any numbness/tingling or changes in bowel/bladder. She does report history back pain "years ago" during pregnancy.  PERTINENT HISTORY:  History of breast cancer   PAIN:  Are you having pain? Yes: NPRS scale: 1 (at worst 6)/10 Pain location: lt low back Pain description: sharp Aggravating factors: prolonged standing,walking, sitting Relieving factors: laying on back with legs elevated; medication   PRECAUTIONS: None  WEIGHT BEARING RESTRICTIONS: No  FALLS:  Has patient fallen in last 6 months? No  LIVING ENVIRONMENT: Lives with: lives alone Lives in: House/apartment Stairs: No Has following equipment at home: None  OCCUPATION: Education administrator   PLOF: Independent  PATIENT GOALS: "I want to be able to resume normal activities and get through work without pain." (Wants to hike, play pickle ball, run).    OBJECTIVE:   DIAGNOSTIC FINDINGS:  Lumbar X-ray:    IMPRESSION: 1. Mild scoliosis concave right. Mild multilevel degenerative change. No acute bony abnormality. No evidence of fracture.   2.  Aortoiliac atherosclerotic vascular disease.  PATIENT SURVEYS:  FOTO 53% function to 66% predicted   SCREENING FOR RED FLAGS: Bowel or bladder incontinence: No Spinal tumors: No Cauda equina syndrome: No Compression fracture: No Abdominal aneurysm: No  COGNITION:  Overall cognitive status: Within  functional limits for tasks assessed     SENSATION: Not tested  MUSCLE LENGTH: Hamstrings: WNL Thomas test: negative   POSTURE: decreased lumbar lordosis  PALPATION: L3-L5 PAIVM hypomobile and concordant pain  TTP Lt lumbar paraspinals; tautness bilaterally   LUMBAR ROM:   AROM eval  Flexion WNL pain   Extension 25% limited pain   Right lateral flexion WNL  Left lateral flexion WNL pain  Right rotation WNL  Left rotation WNL   (Blank rows = not tested)   LOWER EXTREMITY MMT:    MMT Right eval Left eval  Hip flexion 4 4  Hip extension 4 pn 4 pn  Hip abduction 5 5  Hip adduction    Hip internal rotation    Hip external rotation    Knee flexion    Knee extension    Ankle dorsiflexion    Ankle plantarflexion    Ankle inversion    Ankle eversion     (Blank rows = not tested)  LUMBAR SPECIAL TESTS:  SLR (-)  Ely's on Rt caused LBP on the left   FUNCTIONAL TESTS:  Not assessed   GAIT: Distance walked: 10 ft  Assistive device utilized: None Level of assistance: Complete Independence Comments: WNL    TODAY'S TREATMENT:  [facility] Adult PT Treatment:                                                DATE: 07/15/22 Therapeutic Exercise: Demonstrated and issue initial HEP.   Therapeutic Activity: Education on assessment findings that will be addressed throughout duration of POC.       PATIENT EDUCATION:  Education details: see treatment Person educated: Patient Education method: Explanation, Demonstration, Tactile cues, Verbal cues, and Handouts Education comprehension: verbalized understanding, returned demonstration, verbal cues required, tactile cues required, and needs further education   HOME EXERCISE PROGRAM: Access Code: [email or web address] URL: [email or web address] Date: 07/15/2022 Prepared by: Gwendolyn Grant  Exercises - Prone Press Up  - 2 x daily - 7 x weekly - 1 sets - 10 reps - Standing  Lumbar Extension  - 2 x daily - 7 x weekly - 1  sets - 10 reps - Cat Cow  - 2 x daily - 7 x weekly - 1 sets - 10 reps - Supine Lower Trunk Rotation  - 2 x daily - 7 x weekly - 1 sets - 10 reps - 5 sec  hold - Supine Pelvic Tilt  - 2 x daily - 7 x weekly - 2 sets - 10 reps - 5 sec  hold  ASSESSMENT:  CLINICAL IMPRESSION: Patient is a 61 y.o. female who was seen today for physical therapy evaluation and treatment for acute low back pain that began in August 2023 with moving heavy items/furniture in her house and was exacerbated while playing pickle ball in September 2023. Upon assessment she is noted to slightly limited and painful lumbar extension AROM, pain with lumbar flexion and left lateral flexion AROM, concordant pain and hypomobility with L-spine PAIVM, and mild hip weakness. She will benefit from skilled PT to address the above stated deficits in order to optimize her function and assist in overall pain reduction.    OBJECTIVE IMPAIRMENTS: decreased ROM, decreased strength, hypomobility, increased fascial restrictions, postural dysfunction, and pain.   ACTIVITY LIMITATIONS: carrying, lifting, bending, sitting, standing, squatting, and locomotion level  PARTICIPATION LIMITATIONS: community activity, occupation, and recreational activity  PERSONAL FACTORS: Age, Profession, and Time since onset of injury/illness/exacerbation are also affecting patient's functional outcome.   REHAB POTENTIAL: Good  CLINICAL DECISION MAKING: Stable/uncomplicated  EVALUATION COMPLEXITY: Low   GOALS: Goals reviewed with patient? Yes  SHORT TERM GOALS: Target date: 08/05/22  Patient will be independent and compliant with initial HEP.   Baseline: issued at eval  Goal status: INITIAL  2.  Patient will demonstrate pain free lumbar AROM to improve ability to complete reaching and bending activity.  Baseline: see above  Goal status: INITIAL    LONG TERM GOALS: Target date: 08/26/22  Patient will demonstrate 5/5 bilateral hip strength to  improve stability about the chain with prolonged walking and standing.  Baseline: see above  Goal status: INITIAL  2.  Patient will report pain as </=2/10 at worst to improve her tolerance to work-specific activity.  Baseline: 6/10 Goal status: INITIAL  3.  Patient will score at least 66% on FOTO to signify clinically meaningful improvement in functional abilities.   Baseline: 53% Goal status: INITIAL  4.  Patient will return to normal exercise regimen without limitations as it relates to her back.  Baseline: has not participate in rowing and pickle ball  Goal status: INITIAL     PLAN: PT FREQUENCY: 1-2x/week  PT DURATION: other: 4-6 weeks  PLANNED INTERVENTIONS: Therapeutic exercises, Therapeutic activity, Neuromuscular re-education, Balance training, Patient/Family education, Self Care, Dry Needling, Electrical stimulation, Spinal manipulation, Spinal mobilization, Cryotherapy, Moist heat, Taping, Traction, Manual therapy, and Re-evaluation.  PLAN FOR NEXT SESSION: review and progress HEP; manual/TPDN to lumbar spine; spinal mobility   Gwendolyn Grant, PT, DPT, ATC 07/15/22 1:45 PM

## 2022-07-15 ENCOUNTER — Other Ambulatory Visit: Payer: Self-pay

## 2022-07-15 ENCOUNTER — Ambulatory Visit: Payer: No Typology Code available for payment source | Attending: Family Medicine

## 2022-07-15 DIAGNOSIS — M5459 Other low back pain: Secondary | ICD-10-CM | POA: Diagnosis present

## 2022-07-15 DIAGNOSIS — M6281 Muscle weakness (generalized): Secondary | ICD-10-CM | POA: Diagnosis present

## 2022-07-15 DIAGNOSIS — M545 Low back pain, unspecified: Secondary | ICD-10-CM | POA: Diagnosis not present

## 2022-07-15 DIAGNOSIS — M62830 Muscle spasm of back: Secondary | ICD-10-CM | POA: Insufficient documentation

## 2022-07-22 ENCOUNTER — Encounter: Payer: Self-pay | Admitting: Physical Therapy

## 2022-07-22 ENCOUNTER — Ambulatory Visit: Payer: No Typology Code available for payment source | Admitting: Physical Therapy

## 2022-07-22 ENCOUNTER — Other Ambulatory Visit: Payer: Self-pay

## 2022-07-22 DIAGNOSIS — M5459 Other low back pain: Secondary | ICD-10-CM | POA: Diagnosis not present

## 2022-07-22 DIAGNOSIS — M6281 Muscle weakness (generalized): Secondary | ICD-10-CM

## 2022-07-22 NOTE — Therapy (Signed)
OUTPATIENT PHYSICAL THERAPY TREATMENT NOTE   Patient Name: Stacie [name] MRN: [id number] DOB:04-06-1961, 61 y.o., female Today's Date: 07/22/2022  PCP: Faustino Congress, NP REFERRING PROVIDER: Lyndal Pulley, DO   END OF SESSION:   PT End of Session - 07/22/22 1304     Visit Number 2    Number of Visits 13    Date for PT Re-Evaluation 08/29/22    Authorization Type [facility] [employer]    PT Start Time 1218    PT Stop Time 4098    PT Time Calculation (min) 40 min    Activity Tolerance Patient tolerated treatment well    Behavior During Therapy [facility] for tasks assessed/performed             Past Medical History:  Diagnosis Date   Breast cancer ([place]) 2011   DCIS   Cancer [facility])    Past Surgical History:  Procedure Laterality Date   BREAST LUMPECTOMY Right 2011   DCIS   Patient Active Problem List   Diagnosis Date Noted   Lumbar paraspinal muscle spasm 06/09/2022   Medial epicondylitis of elbow, right 10/07/2021   Loss of transverse plantar arch of left foot 10/07/2021   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 There is still one spot that gets pinchy and then the rest of the muscles get aggravated. She does feel like she has improved her flexibility.   PAIN:  Are you having pain? Yes:  NPRS scale: 1/10 (at worst 6/10) Pain location: Left low back Pain description: sharp Aggravating factors:  prolonged standing,walking, sitting Relieving factors: laying on back with legs elevated; medication  PATIENT GOALS: "I want to be able to resume normal activities and get through work without pain." (Wants to hike, play pickle ball, run).    OBJECTIVE: (objective measures completed at initial evaluation unless otherwise dated) PATIENT SURVEYS:  FOTO 53% function to 66% predicted    POSTURE: decreased lumbar lordosis   PALPATION: L3-L5 PAIVM hypomobile and concordant pain  TTP Lt lumbar paraspinals; tautness bilaterally    LUMBAR ROM:    AROM eval  Flexion WNL pain   Extension 25% limited pain   Right lateral flexion WNL  Left lateral flexion WNL pain  Right rotation WNL  Left rotation WNL   (Blank rows = not tested)     LOWER EXTREMITY MMT:     MMT Right eval Left eval Rt / Lt 07/22/2022  Hip flexion 4 4   Hip extension 4 pn 4 pn 4 / 4  Hip abduction 5 5   Hip adduction       Hip internal rotation       Hip external rotation       Knee flexion       Knee extension       Ankle dorsiflexion       Ankle plantarflexion       Ankle inversion       Ankle eversion        (  Blank rows = not tested)   LUMBAR SPECIAL TESTS:  SLR (-)  Ely's on Rt caused LBP on the left      TODAY'S TREATMENT:  [facility] Adult PT Treatment:                                                DATE: 07/22/22 Therapeutic Exercise: Recumbent bike L3 x 5 min while taking subjective Figure-4 LTR with overpressure 3 x 5 sec each Posterior pelvic tilt x 10 Posterior pelvic tilt with alternating march x 10 Bridge with focus on pelvic tilt and raising one vertebra at a time 2 x 10 x 5 sec 90-90 hold with alternating leg lift 10 x 5 sec Cat cow x 10 Bird dog 2 x 10   PATIENT EDUCATION:  Education details: HEP update; playing pickleball at lighter speed to gradually progress back in to upcoming league Person educated: Patient Education method: Explanation, Demonstration, Tactile cues, Verbal cues,  and Handouts Education comprehension: verbalized understanding, returned demonstration, verbal cues required, tactile cues required, and needs further education   HOME EXERCISE PROGRAM: Access Code: [email or web address]    ASSESSMENT: CLINICAL IMPRESSION: Patient tolerated therapy well with no adverse effects. Therapy focused on progressing core stabilization exercises with good tolerance. No increased pain reported with therapy. She does require cueing for abdominal engagement to avoid lumbar lordosis with supine exercises. Updated HEP and patient instructed to play lighter intensity pickleball after good warm-up to gradually work back in to [employer]. Patient would benefit from continued skilled PT to progress her mobility and strength in order to reduce pain and maximize functional ability.     OBJECTIVE IMPAIRMENTS: decreased ROM, decreased strength, hypomobility, increased fascial restrictions, postural dysfunction, and pain.    ACTIVITY LIMITATIONS: carrying, lifting, bending, sitting, standing, squatting, and locomotion level   PARTICIPATION LIMITATIONS: community activity, occupation, and recreational activity   PERSONAL FACTORS: Age, Profession, and Time since onset of injury/illness/exacerbation are also affecting patient's functional outcome.      GOALS: Goals reviewed with patient? Yes   SHORT TERM GOALS: Target date: 08/05/22   Patient will be independent and compliant with initial HEP.  Baseline: issued at eval  Goal status: INITIAL   2.  Patient will demonstrate pain free lumbar AROM to improve ability to complete reaching and bending activity.  Baseline: see above  Goal status: INITIAL   LONG TERM GOALS: Target date: 08/26/22   Patient will demonstrate 5/5 bilateral hip strength to improve stability about the chain with prolonged walking and standing.  Baseline: see above  Goal status: INITIAL   2.  Patient will report pain as </=2/10 at worst to improve  her tolerance to work-specific activity.  Baseline: 6/10 Goal status: INITIAL   3.  Patient will score at least 66% on FOTO to signify clinically meaningful improvement in functional abilities.    Baseline: 53% Goal status: INITIAL   4.  Patient will return to normal exercise regimen without limitations as it relates to her back.  Baseline: has not participate in rowing and pickle ball  Goal status: INITIAL     PLAN: PT FREQUENCY: 1-2x/week   PT DURATION: other: 4-6 weeks   PLANNED INTERVENTIONS: Therapeutic exercises, Therapeutic activity, Neuromuscular re-education, Balance training, Patient/Family education, Self Care, Dry Needling, Electrical stimulation, Spinal manipulation, Spinal mobilization, Cryotherapy, Moist heat, Taping, Traction, Manual therapy, and Re-evaluation.  PLAN FOR NEXT SESSION: review and progress HEP; manual/TPDN to lumbar spine; spinal mobility; gradually introduce dynamic lumbar strengthening for pickleball     Hilda Blades, PT, DPT, LAT, ATC 07/22/22  1:04 PM Phone: [phone] Fax: [phone]

## 2022-07-27 ENCOUNTER — Ambulatory Visit: Payer: No Typology Code available for payment source

## 2022-07-27 DIAGNOSIS — M6281 Muscle weakness (generalized): Secondary | ICD-10-CM

## 2022-07-27 DIAGNOSIS — M5459 Other low back pain: Secondary | ICD-10-CM

## 2022-07-27 NOTE — Patient Instructions (Signed)

## 2022-07-27 NOTE — Therapy (Signed)
OUTPATIENT PHYSICAL THERAPY TREATMENT NOTE   Patient Name: Stacie [name] MRN: [id number] DOB:March 05, 1961, 61 y.o., female Today's Date: 07/27/2022  PCP: Faustino Congress, NP REFERRING PROVIDER: Lyndal Pulley, DO   END OF SESSION:   PT End of Session - 07/27/22 1148     Visit Number 3    Number of Visits 13    Date for PT Re-Evaluation 08/29/22    Authorization Type [facility] [employer]    PT Start Time 1148    PT Stop Time 1230    PT Time Calculation (min) 42 min    Activity Tolerance Patient tolerated treatment well    Behavior During Therapy [facility] for tasks assessed/performed             Past Medical History:  Diagnosis Date   Breast cancer ([place]) 2011   DCIS   Cancer [facility])    Past Surgical History:  Procedure Laterality Date   BREAST LUMPECTOMY Right 2011   DCIS   Patient Active Problem List   Diagnosis Date Noted   Lumbar paraspinal muscle spasm 06/09/2022   Medial epicondylitis of elbow, right 10/07/2021   Loss of transverse plantar arch of left foot 10/07/2021   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   PAIN:  Are you having pain? Yes:  NPRS scale: 1/10 (at worst 6/10) Pain location: Left low back; mid back  Pain description: pinching; tight  Aggravating factors: prolonged standing,walking, sitting Relieving factors: laying on back with  legs elevated; medication  PATIENT GOALS: "I want to be able to resume normal activities and get through work without pain." (Wants to hike, play pickle ball, run).    OBJECTIVE: (objective measures completed at initial evaluation unless otherwise dated) PATIENT SURVEYS:  FOTO 53% function to 66% predicted    POSTURE: decreased lumbar lordosis   PALPATION: L3-L5 PAIVM hypomobile and concordant pain  TTP Lt lumbar paraspinals; tautness bilaterally    LUMBAR ROM:    AROM eval 07/27/22  Flexion WNL pain  WNL; pain   Extension 25% limited pain  WNL; pain  Right lateral flexion WNL WNL  Left lateral flexion WNL pain WNL; pain   Right rotation WNL   Left rotation WNL    (Blank rows = not tested)     LOWER EXTREMITY MMT:     MMT Right eval Left eval Rt / Lt 07/22/2022  Hip flexion 4 4   Hip extension 4 pn 4 pn 4 / 4  Hip abduction 5 5   Hip adduction       Hip internal rotation       Hip external rotation       Knee flexion       Knee extension       Ankle dorsiflexion       Ankle plantarflexion       Ankle inversion       Ankle  eversion        (Blank rows = not tested)   LUMBAR SPECIAL TESTS:  SLR (-)  Ely's on Rt caused LBP on the left      TODAY'S TREATMENT:  [facility] Adult PT Treatment:                                                DATE: 07/27/22 Therapeutic Exercise: Prone press up x 10  Supine pelvic tilts 1 x 10  SLR with posterior pelvic tilt 2 x 10  Hip bridge focus on pelvic tilt and raising one vertebra at a time 2 x 10 x 5 sec Reviewed HEP Manual Therapy: STM bilateral lumbar paraspinals L-spine CPAs grade II-III  Trigger Point Dry Needling Treatment: Pre-treatment instruction: Patient instructed on dry needling rationale, procedures, and possible side effects including pain during treatment (achy,cramping feeling), bruising, drop of blood, lightheadedness, nausea, sweating. Patient Consent Given: Yes Education handout provided: Yes Muscles  treated: Lt lumbar paraspinals  Needle size and number: .30x74m x 2 Treatment response/outcome: Palpable decrease in muscle tension Post-treatment instructions: Patient instructed to expect possible mild to moderate muscle soreness later today and/or tomorrow. Patient instructed in methods to reduce muscle soreness and to continue prescribed HEP. If patient was dry needled over the lung field, patient was instructed on signs and symptoms of pneumothorax and, however unlikely, to see immediate medical attention should they occur. Patient was also educated on signs and symptoms of infection and to seek medical attention should they occur. Patient verbalized understanding of these instructions and education.    [facility]                                                [facility]: [facility] Therapeutic Exercise: Recumbent bike L3 x 5 min while taking subjective Figure-4 LTR with overpressure 3 x 5 sec each Posterior pelvic tilt x 10 Posterior pelvic tilt with alternating march x 10 Bridge with focus on pelvic tilt and raising one vertebra at a time 2 x 10 x 5 sec 90-90 hold with alternating leg lift 10 x 5 sec Cat cow x 10 Bird dog 2 x 10   PATIENT EDUCATION:  Education details: HEP  Person educated: Patient Education method: Explanation, comprehension: verbalized understanding HOME EXERCISE PROGRAM: Access Code: [email or web address]   ASSESSMENT: CLINICAL IMPRESSION: Patient tolerated therapy well with no adverse effects. TPDN was performed to Lt lumbar paraspinals with a reduction in tautness noted post intervention. Focused on progression of spinal mobility and core strengthening, which she tolerated well. She reported soreness in her Lt lumbar paraspinals at conclusion of session.    OBJECTIVE IMPAIRMENTS: decreased ROM, decreased strength, hypomobility, increased fascial restrictions, postural dysfunction, and pain.    ACTIVITY LIMITATIONS: carrying, lifting, bending, sitting, standing,  squatting, and locomotion level   PARTICIPATION LIMITATIONS: community activity, occupation, and recreational activity   PERSONAL FACTORS: Age, Profession, and Time since onset of injury/illness/exacerbation are also affecting patient's functional outcome.      GOALS: Goals reviewed with patient? Yes   SHORT TERM GOALS: Target date: 08/05/22   Patient will be independent and compliant with initial HEP.  Baseline: issued at eval  Goal status: INITIAL   2.  Patient  will demonstrate pain free lumbar AROM to improve ability to complete reaching and bending activity.  Baseline: see above  Goal status: INITIAL   LONG TERM GOALS: Target date: 08/26/22   Patient will demonstrate 5/5 bilateral hip strength to improve stability about the chain with prolonged walking and standing.  Baseline: see above  Goal status: INITIAL   2.  Patient will report pain as </=2/10 at worst to improve her tolerance to work-specific activity.  Baseline: 6/10 Goal status: INITIAL   3.  Patient will score at least 66% on FOTO to signify clinically meaningful improvement in functional abilities.    Baseline: 53% Goal status: INITIAL   4.  Patient will return to normal exercise regimen without limitations as it relates to her back.  Baseline: has not participate in rowing and pickle ball  Goal status: INITIAL     PLAN: PT FREQUENCY: 1-2x/week   PT DURATION: other: 4-6 weeks   PLANNED INTERVENTIONS: Therapeutic exercises, Therapeutic activity, Neuromuscular re-education, Balance training, Patient/Family education, Self Care, Dry Needling, Electrical stimulation, Spinal manipulation, Spinal mobilization, Cryotherapy, Moist heat, Taping, Traction, Manual therapy, and Re-evaluation.   PLAN FOR NEXT SESSION: review and progress HEP; manual/TPDN to lumbar spine; spinal mobility; gradually introduce dynamic lumbar strengthening for pickleball    Gwendolyn Grant, PT, DPT, ATC 07/27/22 12:32 PM

## 2022-07-28 NOTE — Therapy (Signed)
OUTPATIENT PHYSICAL THERAPY TREATMENT NOTE   Patient Name: Stacie [name] MRN: [id number] DOB:[birth year], 61 y.o., female Today's Date: 07/29/2022  PCP: Faustino Congress, NP REFERRING PROVIDER: Lyndal Pulley, DO   END OF SESSION:   PT End of Session - 07/29/22 1134     Visit Number 4    Number of Visits 13    Date for PT Re-Evaluation 08/29/22    Authorization Type [facility] [employer]    PT Start Time 1130    PT Stop Time 1210    PT Time Calculation (min) 40 min    Activity Tolerance Patient tolerated treatment well    Behavior During Therapy [facility] for tasks assessed/performed              Past Medical History:  Diagnosis Date   Breast cancer ([place]) 2011   DCIS   Cancer [facility])    Past Surgical History:  Procedure Laterality Date   BREAST LUMPECTOMY Right 2011   DCIS   Patient Active Problem List   Diagnosis Date Noted   Lumbar paraspinal muscle spasm 06/09/2022   Medial epicondylitis of elbow, right 10/07/2021   Loss of transverse plantar arch of left foot 10/07/2021   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 She has played pickleball and that went well, she did play easier.  PAIN:  Are you having pain? Yes:  NPRS scale: 1-2/10 (at worst 6/10) Pain location: Left low back; mid back  Pain description: Dull, not as sharp or pinch as before Aggravating factors:  prolonged standing,walking, sitting Relieving factors: laying on back with legs elevated; medication  PATIENT GOALS: "I want to be able to resume normal activities and get through work without pain." (Wants to hike, play pickle ball, run).    OBJECTIVE: (objective measures completed at initial evaluation unless otherwise dated) PATIENT SURVEYS:  FOTO 53% function to 66% predicted    POSTURE: decreased lumbar lordosis   PALPATION: L3-L5 PAIVM hypomobile and concordant pain  TTP Lt lumbar paraspinals; tautness bilaterally    LUMBAR ROM:    AROM eval 07/27/22  Flexion WNL pain  WNL; pain   Extension 25% limited pain  WNL; pain  Right lateral flexion WNL WNL  Left lateral flexion WNL pain WNL; pain   Right rotation WNL   Left rotation WNL    (Blank rows = not tested)     LOWER EXTREMITY MMT:     MMT Right eval Left eval Rt / Lt 07/22/2022  Hip flexion 4 4   Hip extension 4 pn 4 pn 4 / 4  Hip abduction 5 5   Hip adduction       Hip internal rotation       Hip external rotation       Knee flexion       Knee extension       Ankle dorsiflexion       Ankle plantarflexion  Ankle inversion       Ankle eversion        (Blank rows = not tested)   LUMBAR SPECIAL TESTS:  SLR (-)  Ely's on Rt caused LBP on the left      TODAY'S TREATMENT:  [facility] Adult PT Treatment:                                                DATE: 07/29/22 Therapeutic Exercise: Recumbent bike L3 x 5 min while taking subjective Supine lumbar rotation stretch 3 x 15 seconds LTR with legs on physioball x 5 each 90-90 alternating leg extension 2 x 10 Marching bridge 2 x 10 1/2 kneeling hip flexor/quad stretch 3 x 20 sec each Bird dog 2 x 10 Pallof press with red power band x 10 each   [facility] Adult PT Treatment:                                                DATE: 07/27/22 Therapeutic Exercise: Prone press up x 10  Supine pelvic tilts 1 x 10  SLR with posterior pelvic tilt 2 x 10  Hip bridge  focus on pelvic tilt and raising one vertebra at a time 2 x 10 x 5 sec Reviewed HEP Manual Therapy: STM bilateral lumbar paraspinals L-spine CPAs grade II-III  Trigger Point Dry Needling Treatment: Pre-treatment instruction: Patient instructed on dry needling rationale, procedures, and possible side effects including pain during treatment (achy,cramping feeling), bruising, drop of blood, lightheadedness, nausea, sweating. Patient Consent Given: Yes Education handout provided: Yes Muscles treated: Lt lumbar paraspinals  Needle size and number: .30x64m x 2 Treatment response/outcome: Palpable decrease in muscle tension Post-treatment instructions: Patient instructed to expect possible mild to moderate muscle soreness later today and/or tomorrow. Patient instructed in methods to reduce muscle soreness and to continue prescribed HEP. If patient was dry needled over the lung field, patient was instructed on signs and symptoms of pneumothorax and, however unlikely, to see immediate medical attention should they occur. Patient was also educated on signs and symptoms of infection and to seek medical attention should they occur. Patient verbalized understanding of these instructions and education.  [facility]                                                [facility]: [facility] Therapeutic Exercise: Recumbent bike L3 x 5 min while taking subjective Figure-4 LTR with overpressure 3 x 5 sec each Posterior pelvic tilt x 10 Posterior pelvic tilt with alternating march x 10 Bridge with focus on pelvic tilt and raising one vertebra at a time 2 x 10 x 5 sec 90-90 hold with alternating leg lift 10 x 5 sec Cat cow x 10 Bird dog 2 x 10   PATIENT EDUCATION:  Education details: HEP update; playing pickleball at lighter speed to gradually progress back in to upcoming league Person educated: Patient Education method: Explanation, Demonstration, Tactile cues, Verbal cues, and Handouts Education  comprehension: verbalized understanding, returned demonstration, verbal cues required, tactile cues required, and needs further education  HOME EXERCISE PROGRAM: Access  Code: [email or web address]    ASSESSMENT: CLINICAL IMPRESSION: Patient tolerated therapy well with no adverse effects. Therapy focused on continued progression of core stabilization with good tolerance. She did report occasional left lower back pinching sensation with bridges. Incorporated hip flexor and quad stretching this visit with good tolerance. Progressed to standing strengthening with pallof press. She does require occasional cueing for proper exercise technique. Updated HEP to progress strengthening at home. Patient would benefit from continued skilled PT to progress her mobility and strength in order to reduce pain and maximize functional ability.    OBJECTIVE IMPAIRMENTS: decreased ROM, decreased strength, hypomobility, increased fascial restrictions, postural dysfunction, and pain.    ACTIVITY LIMITATIONS: carrying, lifting, bending, sitting, standing, squatting, and locomotion level   PARTICIPATION LIMITATIONS: community activity, occupation, and recreational activity   PERSONAL FACTORS: Age, Profession, and Time since onset of injury/illness/exacerbation are also affecting patient's functional outcome.      GOALS: Goals reviewed with patient? Yes   SHORT TERM GOALS: Target date: 08/05/22   Patient will be independent and compliant with initial HEP.  Baseline: issued at eval  Goal status: INITIAL   2.  Patient will demonstrate pain free lumbar AROM to improve ability to complete reaching and bending activity.  Baseline: see above  Goal status: INITIAL   LONG TERM GOALS: Target date: 08/26/22   Patient will demonstrate 5/5 bilateral hip strength to improve stability about the chain with prolonged walking and standing.  Baseline: see above  Goal status: INITIAL   2.  Patient will report pain as </=2/10 at worst to  improve her tolerance to work-specific activity.  Baseline: 6/10 Goal status: INITIAL   3.  Patient will score at least 66% on FOTO to signify clinically meaningful improvement in functional abilities.  Baseline: 53% Goal status: INITIAL   4.  Patient will return to normal exercise regimen without limitations as it relates to her back.  Baseline: has not participate in rowing and pickle ball  Goal status: INITIAL     PLAN: PT FREQUENCY: 1-2x/week   PT DURATION: other: 4-6 weeks   PLANNED INTERVENTIONS: Therapeutic exercises, Therapeutic activity, Neuromuscular re-education, Balance training, Patient/Family education, Self Care, Dry Needling, Electrical stimulation, Spinal manipulation, Spinal mobilization, Cryotherapy, Moist heat, Taping, Traction, Manual therapy, and Re-evaluation.   PLAN FOR NEXT SESSION: review and progress HEP; manual/TPDN to lumbar spine; spinal mobility; gradually introduce dynamic lumbar strengthening for pickleball    Hilda Blades, PT, DPT, LAT, ATC 07/29/22  12:53 PM Phone: [phone] Fax: [phone]

## 2022-07-29 ENCOUNTER — Other Ambulatory Visit: Payer: Self-pay

## 2022-07-29 ENCOUNTER — Encounter: Payer: Self-pay | Admitting: Physical Therapy

## 2022-07-29 ENCOUNTER — Ambulatory Visit: Payer: No Typology Code available for payment source | Attending: Family Medicine | Admitting: Physical Therapy

## 2022-07-29 DIAGNOSIS — M5459 Other low back pain: Secondary | ICD-10-CM | POA: Insufficient documentation

## 2022-07-29 DIAGNOSIS — M6281 Muscle weakness (generalized): Secondary | ICD-10-CM | POA: Insufficient documentation

## 2022-07-29 NOTE — Patient Instructions (Signed)
Access Code: [email or web address] URL: [email or web address] Date: 07/29/2022 Prepared by: Hilda Blades  Exercises - Prone Press Up  - 2 x daily - 7 x weekly - 1 sets - 10 reps - Standing Lumbar Extension  - 2 x daily - 7 x weekly - 1 sets - 10 reps - Cat Cow  - 2 x daily - 7 x weekly - 1 sets - 10 reps - Supine Lower Trunk Rotation  - 2 x daily - 7 x weekly - 1 sets - 10 reps - 5 sec  hold - Supine Pelvic Tilt  - 2 x daily - 7 x weekly - 2 sets - 10 reps - 5 sec  hold - Marching Bridge  - 1 x daily - 3 sets - 10 reps - Supine 90/90 with Leg Extensions  - 1 x daily - 3 sets - 10 reps - Bird Dog  - 1 x daily - 3 sets - 10 reps - Half Kneeling Hip Flexor Stretch  - 1 x daily - 3 reps - 30 seconds hold

## 2022-08-03 ENCOUNTER — Ambulatory Visit: Payer: No Typology Code available for payment source

## 2022-08-03 ENCOUNTER — Other Ambulatory Visit (HOSPITAL_COMMUNITY): Payer: Self-pay

## 2022-08-03 DIAGNOSIS — M5459 Other low back pain: Secondary | ICD-10-CM

## 2022-08-03 DIAGNOSIS — M6281 Muscle weakness (generalized): Secondary | ICD-10-CM

## 2022-08-03 MED ORDER — ROSUVASTATIN CALCIUM 5 MG PO TABS
5.0000 mg | ORAL_TABLET | Freq: Every day | ORAL | 1 refills | Status: AC
  Filled 2022-08-03: qty 90, 90d supply, fill #0
  Filled 2022-10-28 – 2022-11-10 (×2): qty 90, 90d supply, fill #1

## 2022-08-03 MED ORDER — CITALOPRAM HYDROBROMIDE 10 MG PO TABS
10.0000 mg | ORAL_TABLET | Freq: Every day | ORAL | 1 refills | Status: AC
  Filled 2022-08-03: qty 90, 90d supply, fill #0
  Filled 2022-10-28 – 2022-11-10 (×2): qty 90, 90d supply, fill #1

## 2022-08-03 NOTE — Therapy (Signed)
OUTPATIENT PHYSICAL THERAPY TREATMENT NOTE   Patient Name: Stacie [name] MRN: [id number] DOB:[birth year], 61 y.o., female Today's Date: 08/03/2022  PCP: Faustino Congress, NP REFERRING PROVIDER: Lyndal Pulley, DO   END OF SESSION:   PT End of Session - 08/03/22 1147     Visit Number 5    Number of Visits 13    Date for PT Re-Evaluation 08/29/22    Authorization Type [facility] [employer]    PT Start Time 1147    PT Stop Time 0998    PT Time Calculation (min) 55 min    Activity Tolerance Patient tolerated treatment well    Behavior During Therapy [facility] for tasks assessed/performed               Past Medical History:  Diagnosis Date   Breast cancer ([place]) 2011   DCIS   Cancer [facility])    Past Surgical History:  Procedure Laterality Date   BREAST LUMPECTOMY Right 2011   DCIS   Patient Active Problem List   Diagnosis Date Noted   Lumbar paraspinal muscle spasm 06/09/2022   Medial epicondylitis of elbow, right 10/07/2021   Loss of transverse plantar arch of left foot 10/07/2021   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 On Saturday she did about 45 minutes of drills/playing pickleball having an increase in back pain later that day. She took a muscle relaxer, but this did not help with her pain. It was painful to sit and stand after this pain onset on Saturday. The pain started  to ease off yesterday, but is still present.   PAIN:  Are you having pain? Yes:  NPRS scale: 3/10 Pain location: center of low back  Pain description: sharp Aggravating factors: prolonged standing,walking, sitting Relieving factors: laying on back with legs elevated; medication  PATIENT GOALS: "I want to be able to resume normal activities and get through work without pain." (Wants to hike, play pickle ball, run).    OBJECTIVE: (objective measures completed at initial evaluation unless otherwise dated) PATIENT SURVEYS:  FOTO 53% function to 66% predicted    POSTURE: decreased lumbar lordosis   PALPATION: L3-L5 PAIVM hypomobile and concordant pain  TTP Lt lumbar paraspinals; tautness bilaterally    LUMBAR ROM:    AROM eval 07/27/22 08/03/22  Flexion WNL pain  WNL; pain  WNL; pain  Extension 25% limited pain  WNL; pain WNL; pain   Right lateral flexion WNL WNL WNL; pain  Left lateral flexion WNL pain WNL; pain  WNL; pain   Right rotation WNL  WNL  Left rotation WNL  WNL   (Blank rows = not tested)     LOWER EXTREMITY MMT:     MMT Right eval Left eval Rt / Lt 07/22/2022  Hip flexion 4 4   Hip extension 4 pn 4 pn 4 / 4  Hip abduction 5 5   Hip adduction  Hip internal rotation       Hip external rotation       Knee flexion       Knee extension       Ankle dorsiflexion       Ankle plantarflexion       Ankle inversion       Ankle eversion        (Blank rows = not tested)   LUMBAR SPECIAL TESTS:  SLR (-)  Ely's on Rt caused LBP on the left      TODAY'S TREATMENT:  [facility] Adult PT Treatment:                                                DATE: 08/03/22 Therapeutic Exercise: Cat cow x 10 Supine 90/90 march 2 x 10 SL hip bridge 2 x 10   Manual Therapy: STM/DTM bilateral lumbar paraspinals; QL L-spine CPAs grade II Trigger Point Dry Needling Treatment: Pre-treatment instruction: Patient instructed on dry needling rationale, procedures, and possible side  effects including pain during treatment (achy,cramping feeling), bruising, drop of blood, lightheadedness, nausea, sweating. Patient Consent Given: Yes Education handout provided: Previously provided Muscles treated: Lt lumbar paraspinals; Lt L3-5 Multifidi   Needle size and number: .30x77m x 2 Treatment response/outcome: Palpable decrease in muscle tension Post-treatment instructions: Patient instructed to expect possible mild to moderate muscle soreness later today and/or tomorrow. Patient instructed in methods to reduce muscle soreness and to continue prescribed HEP. If patient was dry needled over the lung field, patient was instructed on signs and symptoms of pneumothorax and, however unlikely, to see immediate medical attention should they occur. Patient was also educated on signs and symptoms of infection and to seek medical attention should they occur. Patient verbalized understanding of these instructions and education.  Modalities: MHP to L-spine x 10 minutes  Self Care: Discussed X-ray findings.    [facility]                                                [facility]: 07/29/22 Therapeutic Exercise: Recumbent bike L3 x 5 min while taking subjective Supine lumbar rotation stretch 3 x 15 seconds LTR with legs on physioball x 5 each 90-90 alternating leg extension 2 x 10 Marching bridge 2 x 10 1/2 kneeling hip flexor/quad stretch 3 x 20 sec each Bird dog 2 x 10 Pallof press with red power band x 10 each   [facility] Adult PT Treatment:                                                DATE: 07/27/22 Therapeutic Exercise: Prone press up x 10  Supine pelvic tilts 1 x 10  SLR with posterior pelvic tilt 2 x 10  Hip bridge focus on pelvic tilt and raising one vertebra at a time 2 x 10 x 5 sec Reviewed HEP Manual Therapy: STM bilateral lumbar paraspinals L-spine CPAs grade II-III  Trigger Point Dry Needling Treatment: Pre-treatment instruction: Patient instructed on dry needling  rationale, procedures, and possible side effects including pain during treatment (achy,cramping feeling), bruising,  drop of blood, lightheadedness, nausea, sweating. Patient Consent Given: Yes Education handout provided: Yes Muscles treated: Lt lumbar paraspinals  Needle size and number: .30x67m x 2 Treatment response/outcome: Palpable decrease in muscle tension Post-treatment instructions: Patient instructed to expect possible mild to moderate muscle soreness later today and/or tomorrow. Patient instructed in methods to reduce muscle soreness and to continue prescribed HEP. If patient was dry needled over the lung field, patient was instructed on signs and symptoms of pneumothorax and, however unlikely, to see immediate medical attention should they occur. Patient was also educated on signs and symptoms of infection and to seek medical attention should they occur. Patient verbalized understanding of these instructions and education.    PATIENT EDUCATION:  Education details: gradual increase in recreational activity.  Person educated: Patient Education method: Explanation Education comprehension: verbalized understanding  HOME EXERCISE PROGRAM: Access Code: [email or web address]   ASSESSMENT: CLINICAL IMPRESSION: Patient tolerated therapy well with no adverse effects. She reports  flare up of back pain following pickleball over the weekend. She demonstrates full lumbar AROM, though all ranges cause increased pain with the exception of rotation. Further TPDN was performed to the L-spine with a reduction in tautness noted about lumbar paraspinals. She is noted to have significant tautness about L3-4 multifidi with TPDN and would likely benefit from further TPDN with addition of E-stim at next session. Continued with lumbopelvic strengthening without an increase in back pain. Finished session with MHP to help with soreness.     OBJECTIVE IMPAIRMENTS: decreased ROM, decreased strength, hypomobility,  increased fascial restrictions, postural dysfunction, and pain.    ACTIVITY LIMITATIONS: carrying, lifting, bending, sitting, standing, squatting, and locomotion level   PARTICIPATION LIMITATIONS: community activity, occupation, and recreational activity   PERSONAL FACTORS: Age, Profession, and Time since onset of injury/illness/exacerbation are also affecting patient's functional outcome.      GOALS: Goals reviewed with patient? Yes   SHORT TERM GOALS: Target date: 08/05/22   Patient will be independent and compliant with initial HEP.  Baseline: issued at eval  Goal status: met   2.  Patient will demonstrate pain free lumbar AROM to improve ability to complete reaching and bending activity.  Baseline: see above  Goal status: ongoing    LONG TERM GOALS: Target date: 08/26/22   Patient will demonstrate 5/5 bilateral hip strength to improve stability about the chain with prolonged walking and standing.  Baseline: see above  Goal status: INITIAL   2.  Patient will report pain as </=2/10 at worst to improve her tolerance to work-specific activity.  Baseline: 6/10 Goal status: INITIAL   3.  Patient will score at least 66% on FOTO to signify clinically meaningful improvement in functional abilities.  Baseline: 53% Goal status: INITIAL   4.  Patient will return to normal exercise regimen without limitations as it relates to her back.  Baseline: has not participate in rowing and pickle ball  Goal status: INITIAL     PLAN: PT FREQUENCY: 1-2x/week   PT DURATION: other: 4-6 weeks   PLANNED INTERVENTIONS: Therapeutic exercises, Therapeutic activity, Neuromuscular re-education, Balance training, Patient/Family education, Self Care, Dry Needling, Electrical stimulation, Spinal manipulation, Spinal mobilization, Cryotherapy, Moist heat, Taping, Traction, Manual therapy, and Re-evaluation.   PLAN FOR NEXT SESSION: review and progress HEP; manual/TPDN to lumbar spine; spinal mobility;  gradually introduce dynamic lumbar strengthening for pickleball   SGwendolyn Grant PT, DPT, ATC 08/03/22 12:42 PM

## 2022-08-06 ENCOUNTER — Ambulatory Visit: Payer: No Typology Code available for payment source

## 2022-08-06 DIAGNOSIS — M6281 Muscle weakness (generalized): Secondary | ICD-10-CM

## 2022-08-06 DIAGNOSIS — M5459 Other low back pain: Secondary | ICD-10-CM | POA: Diagnosis not present

## 2022-08-06 NOTE — Therapy (Signed)
OUTPATIENT PHYSICAL THERAPY TREATMENT NOTE   Patient Name: Stacie [name] MRN: [id number] DOB:May 07, 1961, 61 y.o., female Today's Date: 08/06/2022  PCP: Faustino Congress, NP REFERRING PROVIDER: Lyndal Pulley, DO   END OF SESSION:   PT End of Session - 08/06/22 1616     Visit Number 6    Number of Visits 13    Date for PT Re-Evaluation 08/29/22    Authorization Type [facility] [employer]    PT Start Time 4010    PT Stop Time 2725    PT Time Calculation (min) 42 min    Activity Tolerance Patient tolerated treatment well    Behavior During Therapy [facility] for tasks assessed/performed                Past Medical History:  Diagnosis Date   Breast cancer ([place]) 2011   DCIS   Cancer [facility])    Past Surgical History:  Procedure Laterality Date   BREAST LUMPECTOMY Right 2011   DCIS   Patient Active Problem List   Diagnosis Date Noted   Lumbar paraspinal muscle spasm 06/09/2022   Medial epicondylitis of elbow, right 10/07/2021   Loss of transverse plantar arch of left foot 10/07/2021   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   PAIN:  Are you having pain? Yes:  NPRS scale: 1/10 Pain location: center of low back  Pain description: pinching Aggravating factors: prolonged standing,walking, sitting Relieving factors: laying on back with legs elevated; medication  PATIENT GOALS: "I want to be  able to resume normal activities and get through work without pain." (Wants to hike, play pickle ball, run).    OBJECTIVE: (objective measures completed at initial evaluation unless otherwise dated) PATIENT SURVEYS:  FOTO 53% function to 66% predicted    POSTURE: decreased lumbar lordosis   PALPATION: L3-L5 PAIVM hypomobile and concordant pain  TTP Lt lumbar paraspinals; tautness bilaterally    LUMBAR ROM:    AROM eval 07/27/22 08/03/22 08/06/22  Flexion WNL pain  WNL; pain  WNL; pain WNL; pain (improved post manipulation)   Extension 25% limited pain  WNL; pain WNL; pain  NWL; pain (improved post manipulation)  Right lateral flexion WNL WNL WNL; pain   Left lateral flexion WNL pain WNL; pain  WNL; pain    Right rotation WNL  WNL   Left rotation WNL  WNL    (Blank rows = not tested)     LOWER EXTREMITY MMT:     MMT Right eval Left eval Rt / Lt 07/22/2022  Hip flexion 4 4   Hip extension 4 pn 4 pn 4 / 4  Hip abduction 5 5   Hip adduction       Hip internal rotation       Hip external rotation       Knee flexion       Knee extension       Ankle dorsiflexion  Ankle plantarflexion       Ankle inversion       Ankle eversion        (Blank rows = not tested)   LUMBAR SPECIAL TESTS:  SLR (-)  Ely's on Rt caused LBP on the left      TODAY'S TREATMENT:  [facility] Adult PT Treatment:                                                DATE: 08/06/22 Therapeutic Exercise: 90/90 march 2 x 10  90/90 alternating leg extension 2 x 10 3 way hip on airex 2 x 10  Hip hinge in standing with dowel x 10  Deadlift x 10; 5 lbs  Manual Therapy: STM bilateral lumbar paraspinals Lumbar manipulation with cavitation     [facility] Adult PT Treatment:                                                DATE: 08/03/22 Therapeutic Exercise: Cat cow x 10 Supine 90/90 march 2 x 10 SL hip bridge 2 x 10   Manual Therapy: STM/DTM bilateral lumbar paraspinals; QL L-spine CPAs grade II Trigger Point Dry  Needling Treatment: Pre-treatment instruction: Patient instructed on dry needling rationale, procedures, and possible side effects including pain during treatment (achy,cramping feeling), bruising, drop of blood, lightheadedness, nausea, sweating. Patient Consent Given: Yes Education handout provided: Previously provided Muscles treated: Lt lumbar paraspinals; Lt L3-5 Multifidi   Needle size and number: .30x29m x 2 Treatment response/outcome: Palpable decrease in muscle tension Post-treatment instructions: Patient instructed to expect possible mild to moderate muscle soreness later today and/or tomorrow. Patient instructed in methods to reduce muscle soreness and to continue prescribed HEP. If patient was dry needled over the lung field, patient was instructed on signs and symptoms of pneumothorax and, however unlikely, to see immediate medical attention should they occur. Patient was also educated on signs and symptoms of infection and to seek medical attention should they occur. Patient verbalized understanding of these instructions and education.  Modalities: MHP to L-spine x 10 minutes  Self Care: Discussed X-ray findings.    [facility]                                                DATE: 07/29/22 Therapeutic Exercise: Recumbent bike L3 x 5 min while taking subjective Supine lumbar rotation stretch 3 x 15 seconds LTR with legs on physioball x 5 each 90-90 alternating leg extension 2 x 10 Marching bridge 2 x 10 1/2 kneeling hip flexor/quad stretch 3 x 20 sec each Bird dog 2 x 10 Pallof press with red power band x 10 each     PATIENT EDUCATION:  Education details: gradual increase in recreational activity.  Person educated: Patient Education method: Explanation Education comprehension: verbalized understanding  HOME EXERCISE PROGRAM: Access Code: [email or web address]   ASSESSMENT: CLINICAL IMPRESSION: Patient tolerated therapy well with no adverse effects. Lumbar  manipulation performed with cavitation noted and patient reporting less discomfort with lumbar flexion and extension AROM following this intervention. Focused on progression of dynamic core  stabilization with patient demonstrating good core activation. Began working on bending mechanics focusing on hip hinge. She has initial difficulty with standing hip hinge, but with continued practice and reps she is able to properly perform without back pain.     OBJECTIVE IMPAIRMENTS: decreased ROM, decreased strength, hypomobility, increased fascial restrictions, postural dysfunction, and pain.    ACTIVITY LIMITATIONS: carrying, lifting, bending, sitting, standing, squatting, and locomotion level   PARTICIPATION LIMITATIONS: community activity, occupation, and recreational activity   PERSONAL FACTORS: Age, Profession, and Time since onset of injury/illness/exacerbation are also affecting patient's functional outcome.      GOALS: Goals reviewed with patient? Yes   SHORT TERM GOALS: Target date: 08/05/22   Patient will be independent and compliant with initial HEP.  Baseline: issued at eval  Goal status: met   2.  Patient will demonstrate pain free lumbar AROM to improve ability to complete reaching and bending activity.  Baseline: see above  Goal status: ongoing    LONG TERM GOALS: Target date: 08/26/22   Patient will demonstrate 5/5 bilateral hip strength to improve stability about the chain with prolonged walking and standing.  Baseline: see above  Goal status: INITIAL   2.  Patient will report pain as </=2/10 at worst to improve her tolerance to work-specific activity.  Baseline: 6/10 Goal status: INITIAL   3.  Patient will score at least 66% on FOTO to signify clinically meaningful improvement in functional abilities.  Baseline: 53% Goal status: INITIAL   4.  Patient will return to normal exercise regimen without limitations as it relates to her back.  Baseline: has not participate in  rowing and pickle ball  Goal status: INITIAL     PLAN: PT FREQUENCY: 1-2x/week   PT DURATION: other: 4-6 weeks   PLANNED INTERVENTIONS: Therapeutic exercises, Therapeutic activity, Neuromuscular re-education, Balance training, Patient/Family education, Self Care, Dry Needling, Electrical stimulation, Spinal manipulation, Spinal mobilization, Cryotherapy, Moist heat, Taping, Traction, Manual therapy, and Re-evaluation.   PLAN FOR NEXT SESSION: review and progress HEP; manual/TPDN to lumbar spine; spinal mobility; gradually introduce dynamic lumbar strengthening for pickleball   Gwendolyn Grant, PT, DPT, ATC 08/06/22 4:59 PM

## 2022-08-10 ENCOUNTER — Ambulatory Visit: Payer: No Typology Code available for payment source

## 2022-08-10 DIAGNOSIS — M5459 Other low back pain: Secondary | ICD-10-CM

## 2022-08-10 DIAGNOSIS — M6281 Muscle weakness (generalized): Secondary | ICD-10-CM

## 2022-08-10 NOTE — Therapy (Signed)
OUTPATIENT PHYSICAL THERAPY TREATMENT NOTE   Patient Name: Stacie [name] MRN: [id number] DOB:03-11-1961, 61 y.o., female Today's Date: 08/10/2022  PCP: Faustino Congress, NP REFERRING PROVIDER: Lyndal Pulley, DO   END OF SESSION:   PT End of Session - 08/10/22 1145     Visit Number 7    Number of Visits 13    Date for PT Re-Evaluation 08/29/22    Authorization Type [facility] [employer]    PT Start Time 9892    PT Stop Time 1230    PT Time Calculation (min) 45 min    Activity Tolerance Patient tolerated treatment well    Behavior During Therapy [facility] for tasks assessed/performed                 Past Medical History:  Diagnosis Date   Breast cancer ([place]) 2011   DCIS   Cancer [facility])    Past Surgical History:  Procedure Laterality Date   BREAST LUMPECTOMY Right 2011   DCIS   Patient Active Problem List   Diagnosis Date Noted   Lumbar paraspinal muscle spasm 06/09/2022   Medial epicondylitis of elbow, right 10/07/2021   Loss of transverse plantar arch of left foot 10/07/2021   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   Depression 01/11/2014   Essential hypertension 01/11/2014   Migraine 01/11/2014   Mixed hyperlipidemia 01/11/2014   NASH (nonalcoholic steatohepatitis) 01/11/2014   Sleep disturbance 01/11/2014    REFERRING DIAG: Lumbar paraspinal muscle spasm, Lumbar spine pain  THERAPY DIAG:  Other low back pain  Muscle weakness (generalized)  Rationale for Evaluation and Treatment Rehabilitation  PERTINENT HISTORY: History of breast cancer   PRECAUTIONS: None   SUBJECTIVE:       SUBJECTIVE STATEMENT:  Patient reports she is doing a lot better. Still a little bit of pain, but feels the manipulation really helped.   PAIN:  Are you having pain? Yes:  NPRS scale: "0.5"/10 Pain location: center of low back  Pain description: pinching Aggravating factors: prolonged standing,walking, sitting Relieving factors: laying on back with legs  elevated; medication  PATIENT GOALS: "I want to be able to resume normal activities and get through work without pain." (Wants to hike, play pickle ball, run).    OBJECTIVE: (objective measures completed at initial evaluation unless otherwise dated) PATIENT SURVEYS:  FOTO 53% function to 66% predicted    POSTURE: decreased lumbar lordosis   PALPATION: L3-L5 PAIVM hypomobile and concordant pain  TTP Lt lumbar paraspinals; tautness bilaterally    LUMBAR ROM:    AROM eval 07/27/22 08/03/22 08/06/22  Flexion WNL pain  WNL; pain  WNL; pain WNL; pain (improved post manipulation)   Extension 25% limited pain  WNL; pain WNL; pain  NWL; pain (improved post manipulation)  Right lateral flexion WNL WNL WNL; pain   Left lateral flexion WNL pain WNL; pain  WNL; pain    Right rotation WNL  WNL   Left rotation WNL  WNL    (Blank rows = not tested)     LOWER EXTREMITY MMT:     MMT Right eval Left eval Rt / Lt 07/22/2022  Hip flexion 4 4   Hip extension 4 pn 4 pn 4 / 4  Hip abduction 5 5   Hip adduction       Hip internal rotation       Hip external rotation       Knee flexion       Knee extension  Ankle dorsiflexion       Ankle plantarflexion       Ankle inversion       Ankle eversion        (Blank rows = not tested)   LUMBAR SPECIAL TESTS:  SLR (-)  Ely's on Rt caused LBP on the left      TODAY'S TREATMENT:  [facility] Adult PT Treatment:                                                DATE: 08/10/22 Therapeutic Exercise: Elliptical level 4 x 5 minutes  Dynamic stretching: walking quad stretch, Frankenstein, walking piriformis stretch, walking hamstring stretch 2 x 15 ft each  Forward lunge 2 x 10  Reverse lunge 2 x 10  Step up knee driver to triple extension 2 x 10; 8 inch step  Updated HEP     [facility] Adult PT Treatment:                                                DATE: 08/06/22 Therapeutic Exercise: 90/90 march 2 x 10  90/90 alternating leg extension 2 x 10 3 way  hip on airex 2 x 10  Hip hinge in standing with dowel x 10  Deadlift x 10; 5 lbs  Manual Therapy: STM bilateral lumbar paraspinals Lumbar manipulation with cavitation     [facility] Adult PT Treatment:                                                DATE: 08/03/22 Therapeutic Exercise: Cat cow x 10 Supine 90/90 march 2 x 10 SL hip bridge 2 x 10   Manual Therapy: STM/DTM bilateral lumbar paraspinals; QL L-spine CPAs grade II Trigger Point Dry Needling Treatment: Pre-treatment instruction: Patient instructed on dry needling rationale, procedures, and possible side effects including pain during treatment (achy,cramping feeling), bruising, drop of blood, lightheadedness, nausea, sweating. Patient Consent Given: Yes Education handout provided: Previously provided Muscles treated: Lt lumbar paraspinals; Lt L3-5 Multifidi   Needle size and number: .30x59m x 2 Treatment response/outcome: Palpable decrease in muscle tension Post-treatment instructions: Patient instructed to expect possible mild to moderate muscle soreness later today and/or tomorrow. Patient instructed in methods to reduce muscle soreness and to continue prescribed HEP. If patient was dry needled over the lung field, patient was instructed on signs and symptoms of pneumothorax and, however unlikely, to see immediate medical attention should they occur. Patient was also educated on signs and symptoms of infection and to seek medical attention should they occur. Patient verbalized understanding of these instructions and education.  Modalities: MHP to L-spine x 10 minutes  Self Care: Discussed X-ray findings.    [facility]                                                [facility]: 07[facility]22 Therapeutic Exercise: Recumbent bike L3 x 5 min while taking subjective Supine lumbar rotation stretch 3  x 15 seconds LTR with legs on physioball x 5 each 90-90 alternating leg extension 2 x 10 Marching bridge 2 x 10 1/2 kneeling hip  flexor/quad stretch 3 x 20 sec each Bird dog 2 x 10 Pallof press with red power band x 10 each     PATIENT EDUCATION:  Education details: recommendation for where to purchase footwear  Person educated: Patient Education method: Explanation Education comprehension: verbalized understanding  HOME EXERCISE PROGRAM: Access Code: [email or web address]    ASSESSMENT: CLINICAL IMPRESSION: Patient tolerated therapy well with no adverse effects. She reports an overall improvement in her back pain since last session stating that the lumbar manipulation was very helpful. Focused on progression of standing strengthening, which she tolerated well without an increase in back pain. With lunge activity she has occasional difficulty maintaining neutral lumbopelvic positioning, but is able to correct independently. She has more difficulty maintaining stability with SL activity on the RLE. HEP updated to include further strengthening.     OBJECTIVE IMPAIRMENTS: decreased ROM, decreased strength, hypomobility, increased fascial restrictions, postural dysfunction, and pain.    ACTIVITY LIMITATIONS: carrying, lifting, bending, sitting, standing, squatting, and locomotion level   PARTICIPATION LIMITATIONS: community activity, occupation, and recreational activity   PERSONAL FACTORS: Age, Profession, and Time since onset of injury/illness/exacerbation are also affecting patient's functional outcome.      GOALS: Goals reviewed with patient? Yes   SHORT TERM GOALS: Target date: 08/05/22   Patient will be independent and compliant with initial HEP.  Baseline: issued at eval  Goal status: met   2.  Patient will demonstrate pain free lumbar AROM to improve ability to complete reaching and bending activity.  Baseline: see above  Goal status: ongoing    LONG TERM GOALS: Target date: 08/26/22   Patient will demonstrate 5/5 bilateral hip strength to improve stability about the chain with prolonged walking and  standing.  Baseline: see above  Goal status: INITIAL   2.  Patient will report pain as </=2/10 at worst to improve her tolerance to work-specific activity.  Baseline: 6/10 Goal status: INITIAL   3.  Patient will score at least 66% on FOTO to signify clinically meaningful improvement in functional abilities.  Baseline: 53% Goal status: INITIAL   4.  Patient will return to normal exercise regimen without limitations as it relates to her back.  Baseline: has not participate in rowing and pickle ball  Goal status: INITIAL     PLAN: PT FREQUENCY: 1-2x/week   PT DURATION: other: 4-6 weeks   PLANNED INTERVENTIONS: Therapeutic exercises, Therapeutic activity, Neuromuscular re-education, Balance training, Patient/Family education, Self Care, Dry Needling, Electrical stimulation, Spinal manipulation, Spinal mobilization, Cryotherapy, Moist heat, Taping, Traction, Manual therapy, and Re-evaluation.   PLAN FOR NEXT SESSION: review and progress HEP; manual/TPDN to lumbar spine; spinal mobility; gradually introduce dynamic lumbar strengthening for pickleball   Gwendolyn Grant, PT, DPT, ATC 08/10/22 12:32 PM

## 2022-08-13 ENCOUNTER — Encounter: Payer: Self-pay | Admitting: Physical Therapy

## 2022-08-13 ENCOUNTER — Ambulatory Visit: Payer: No Typology Code available for payment source | Admitting: Physical Therapy

## 2022-08-13 ENCOUNTER — Other Ambulatory Visit: Payer: Self-pay

## 2022-08-13 DIAGNOSIS — M6281 Muscle weakness (generalized): Secondary | ICD-10-CM

## 2022-08-13 DIAGNOSIS — M5459 Other low back pain: Secondary | ICD-10-CM

## 2022-08-13 NOTE — Therapy (Signed)
OUTPATIENT PHYSICAL THERAPY TREATMENT NOTE   Patient Name: Stacie [name] MRN: [id number] DOB:Nov 05, 1960, 61 y.o., female Today's Date: 08/13/2022  PCP: Faustino Congress, NP REFERRING PROVIDER: Lyndal Pulley, DO   END OF SESSION:   PT End of Session - 08/13/22 1700     Visit Number 8    Number of Visits 13    Date for PT Re-Evaluation 08/29/22    Authorization Type [facility] [employer]    PT Start Time 1700    PT Stop Time 5670    PT Time Calculation (min) 40 min    Activity Tolerance Patient tolerated treatment well    Behavior During Therapy [facility] for tasks assessed/performed                  Past Medical History:  Diagnosis Date   Breast cancer ([place]) 2011   DCIS   Cancer [facility])    Past Surgical History:  Procedure Laterality Date   BREAST LUMPECTOMY Right 2011   DCIS   Patient Active Problem List   Diagnosis Date Noted   Lumbar paraspinal muscle spasm 06/09/2022   Medial epicondylitis of elbow, right 10/07/2021   Loss of transverse plantar arch of left foot 10/07/2021   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   Depression 01/11/2014   Essential hypertension 01/11/2014   Migraine 01/11/2014   Mixed hyperlipidemia 01/11/2014   NASH (nonalcoholic steatohepatitis) 01/11/2014   Sleep disturbance 01/11/2014    REFERRING DIAG: Lumbar paraspinal muscle spasm, Lumbar spine pain  THERAPY DIAG:  Other low back pain  Muscle weakness (generalized)  Rationale for Evaluation and Treatment Rehabilitation  PERTINENT HISTORY: History of breast cancer   PRECAUTIONS: None   SUBJECTIVE:       SUBJECTIVE STATEMENT: patient reports she continues to feel much better, she feels she turned a corner following the manipulation.  PAIN:  Are you having pain? Yes:  NPRS scale: 1/10 Pain location: center of low back  Pain description: tired Aggravating factors: prolonged standing,walking, sitting Relieving factors: laying on back with legs elevated;  medication  PATIENT GOALS: "I want to be able to resume normal activities and get through work without pain." (Wants to hike, play pickle ball, run).    OBJECTIVE: (objective measures completed at initial evaluation unless otherwise dated) PATIENT SURVEYS:  FOTO 53% function to 66% predicted    POSTURE: decreased lumbar lordosis   PALPATION: L3-L5 PAIVM hypomobile and concordant pain  TTP Lt lumbar paraspinals; tautness bilaterally    LUMBAR ROM:    AROM eval 07/27/22 08/03/22 08/06/22  Flexion WNL pain  WNL; pain  WNL; pain WNL; pain (improved post manipulation)   Extension 25% limited pain  WNL; pain WNL; pain  NWL; pain (improved post manipulation)  Right lateral flexion WNL WNL WNL; pain   Left lateral flexion WNL pain WNL; pain  WNL; pain    Right rotation WNL  WNL   Left rotation WNL  WNL    (Blank rows = not tested)     LOWER EXTREMITY MMT:     MMT Right eval Left eval Rt / Lt 07/22/2022  Hip flexion 4 4   Hip extension 4 pn 4 pn 4 / 4  Hip abduction 5 5   Hip adduction       Hip internal rotation       Hip external rotation       Knee flexion       Knee extension       Ankle dorsiflexion  Ankle plantarflexion       Ankle inversion       Ankle eversion        (Blank rows = not tested)   LUMBAR SPECIAL TESTS:  SLR (-)  Ely's on Rt caused LBP on the left      TODAY'S TREATMENT:  [facility] Adult PT Treatment:                                                DATE: 08/13/22 Therapeutic Exercise: Recumbent bike L3 x 3 min while taking subjective Supine lumbar rotation stretch x 5 each Cat cow x 10 Bird dog x 10 SL bridge x 8 each Deadlift 30# DB 2 x 10 - cued for proper hip hinge maintaining neutral lumbar spine Farmer's march with unilateral 25# carry x 20 each hand Forward lunge with trunk rotation over front leg holding red med ball x 1 length of gym down/back Lateral band walk with blue at knees 2 x 1 length of gym down/back KB swing 15# 2 x 15 - cued  for proper hip thrust to use momentum to swing KB rather than lift Pallof press with red power band 2 x 10 each   [facility] Adult PT Treatment:                                                DATE: 08/10/22 Therapeutic Exercise: Elliptical level 4 x 5 minutes  Dynamic stretching: walking quad stretch, Frankenstein, walking piriformis stretch, walking hamstring stretch 2 x 15 ft each  Forward lunge 2 x 10  Reverse lunge 2 x 10  Step up knee driver to triple extension 2 x 10; 8 inch step  Updated HEP   [facility] Adult PT Treatment:                                                DATE: 08/06/22 Therapeutic Exercise: 90/90 march 2 x 10  90/90 alternating leg extension 2 x 10 3 way hip on airex 2 x 10  Hip hinge in standing with dowel x 10  Deadlift x 10; 5 lbs  Manual Therapy: STM bilateral lumbar paraspinals Lumbar manipulation with cavitation   PATIENT EDUCATION:  Education details: HEP Person educated: Patient Education method: Explanation Education comprehension: Verbalized understanding  HOME EXERCISE PROGRAM: Access Code: [email or web address]    ASSESSMENT: CLINICAL IMPRESSION: Patient tolerated therapy well with no adverse effects. Therapy focused primarily on progress strength and control with good tolerance. Majority of session performed in upright position and initiate lifting with occasional cues require for technique and control. She did not report any pain with therapy, but did note low back fatigue. No specific changes to HEP but patient encouraged to perform KB swings at home. Patient would benefit from continued skilled PT to progress her mobility and strength in order to reduce pain and maximize functional ability.     OBJECTIVE IMPAIRMENTS: decreased ROM, decreased strength, hypomobility, increased fascial restrictions, postural dysfunction, and pain.    ACTIVITY LIMITATIONS: carrying, lifting, bending, sitting, standing, squatting, and locomotion level   PARTICIPATION LIMITATIONS:  community activity,  occupation, and recreational activity   PERSONAL FACTORS: Age, Profession, and Time since onset of injury/illness/exacerbation are also affecting patient's functional outcome.      GOALS: Goals reviewed with patient? Yes   SHORT TERM GOALS: Target date: 08/05/22   Patient will be independent and compliant with initial HEP.  Baseline: issued at eval  Goal status: met   2.  Patient will demonstrate pain free lumbar AROM to improve ability to complete reaching and bending activity.  Baseline: see above  Goal status: ongoing    LONG TERM GOALS: Target date: 08/26/22   Patient will demonstrate 5/5 bilateral hip strength to improve stability about the chain with prolonged walking and standing.  Baseline: see above  Goal status: INITIAL   2.  Patient will report pain as </=2/10 at worst to improve her tolerance to work-specific activity.  Baseline: 6/10 Goal status: INITIAL   3.  Patient will score at least 66% on FOTO to signify clinically meaningful improvement in functional abilities.  Baseline: 53% Goal status: INITIAL   4.  Patient will return to normal exercise regimen without limitations as it relates to her back.  Baseline: has not participate in rowing and pickle ball  Goal status: INITIAL     PLAN: PT FREQUENCY: 1-2x/week   PT DURATION: other: 4-6 weeks   PLANNED INTERVENTIONS: Therapeutic exercises, Therapeutic activity, Neuromuscular re-education, Balance training, Patient/Family education, Self Care, Dry Needling, Electrical stimulation, Spinal manipulation, Spinal mobilization, Cryotherapy, Moist heat, Taping, Traction, Manual therapy, and Re-evaluation.   PLAN FOR NEXT SESSION: review and progress HEP; manual/TPDN to lumbar spine; spinal mobility; gradually introduce dynamic lumbar strengthening for pickleball    Hilda Blades, PT, DPT, LAT, ATC 08/13/22  5:53 PM Phone: [phone] Fax: [phone]

## 2022-08-17 ENCOUNTER — Ambulatory Visit: Payer: No Typology Code available for payment source

## 2022-08-17 DIAGNOSIS — M5459 Other low back pain: Secondary | ICD-10-CM | POA: Diagnosis not present

## 2022-08-17 DIAGNOSIS — M6281 Muscle weakness (generalized): Secondary | ICD-10-CM

## 2022-08-17 NOTE — Therapy (Signed)
OUTPATIENT PHYSICAL THERAPY TREATMENT NOTE   Patient Name: Stacie [name] MRN: [id number] DOB:10/08/1960, 61 y.o., female Today's Date: 08/17/2022  PCP: Faustino Congress, NP REFERRING PROVIDER: Lyndal Pulley, DO   END OF SESSION:   PT End of Session - 08/17/22 1057     Visit Number 9    Number of Visits 13    Date for PT Re-Evaluation 08/29/22    Authorization Type [facility] [employer]    PT Start Time 1057    PT Stop Time 8832    PT Time Calculation (min) 48 min    Activity Tolerance Patient tolerated treatment well    Behavior During Therapy [facility] for tasks assessed/performed                   Past Medical History:  Diagnosis Date   Breast cancer ([place]) 2011   DCIS   Cancer [facility])    Past Surgical History:  Procedure Laterality Date   BREAST LUMPECTOMY Right 2011   DCIS   Patient Active Problem List   Diagnosis Date Noted   Lumbar paraspinal muscle spasm 06/09/2022   Medial epicondylitis of elbow, right 10/07/2021   Loss of transverse plantar arch of left foot 10/07/2021   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   Depression 01/11/2014   Essential hypertension 01/11/2014   Migraine 01/11/2014   Mixed hyperlipidemia 01/11/2014   NASH (nonalcoholic steatohepatitis) 01/11/2014   Sleep disturbance 01/11/2014    REFERRING DIAG: Lumbar paraspinal muscle spasm, Lumbar spine pain  THERAPY DIAG:  Other low back pain  Muscle weakness (generalized)  Rationale for Evaluation and Treatment Rehabilitation  PERTINENT HISTORY: History of breast cancer   PRECAUTIONS: None   SUBJECTIVE:       SUBJECTIVE STATEMENT:  Patient reports she just played 2 hours of pickle ball and is feeling pretty good after this.  PAIN:  Are you having pain? Yes:  NPRS scale: 1/10 Pain location: center of low back  Pain description: twinge  Aggravating factors: prolonged standing,walking, sitting Relieving factors: laying on back with legs elevated;  medication  PATIENT GOALS: "I want to be able to resume normal activities and get through work without pain." (Wants to hike, play pickle ball, run).    OBJECTIVE: (objective measures completed at initial evaluation unless otherwise dated) PATIENT SURVEYS:  FOTO 53% function to 66% predicted    POSTURE: decreased lumbar lordosis   PALPATION: L3-L5 PAIVM hypomobile and concordant pain  TTP Lt lumbar paraspinals; tautness bilaterally    LUMBAR ROM:    AROM eval 07/27/22 08/03/22 08/06/22  Flexion WNL pain  WNL; pain  WNL; pain WNL; pain (improved post manipulation)   Extension 25% limited pain  WNL; pain WNL; pain  NWL; pain (improved post manipulation)  Right lateral flexion WNL WNL WNL; pain   Left lateral flexion WNL pain WNL; pain  WNL; pain    Right rotation WNL  WNL   Left rotation WNL  WNL    (Blank rows = not tested)     LOWER EXTREMITY MMT:     MMT Right eval Left eval Rt / Lt 07/22/2022  Hip flexion 4 4   Hip extension 4 pn 4 pn 4 / 4  Hip abduction 5 5   Hip adduction       Hip internal rotation       Hip external rotation       Knee flexion       Knee extension  Ankle dorsiflexion       Ankle plantarflexion       Ankle inversion       Ankle eversion        (Blank rows = not tested)   LUMBAR SPECIAL TESTS:  SLR (-)  Ely's on Rt caused LBP on the left      TODAY'S TREATMENT:  [facility] Adult PT Treatment:                                                DATE: 08/17/22 Therapeutic Exercise: Dynamic warm-up: walking quad stretch, walking piriformis stretch, walking hamstring stretch, frankensteins 2 x 10 ft each  Walking lunge with med ball rotation; 8 lbs 2 sets d/b x 10 ft  Wall squat with resisted shoulder flexion 2 x 10; 3# Kettlebell swings 2 x 15; 15# Reverse lunge on slider 2 x 10 bilateral  Jump squats x 4; d/c due to foot pain Hamstring curls on stability ball 2 x 15 Dead bug x 20    [facility] Adult PT Treatment:                                                 DATE: 08/13/22 Therapeutic Exercise: Recumbent bike L3 x 3 min while taking subjective Supine lumbar rotation stretch x 5 each Cat cow x 10 Bird dog x 10 SL bridge x 8 each Deadlift 30# DB 2 x 10 - cued for proper hip hinge maintaining neutral lumbar spine Farmer's march with unilateral 25# carry x 20 each hand Forward lunge with trunk rotation over front leg holding red med ball x 1 length of gym down/back Lateral band walk with blue at knees 2 x 1 length of gym down/back KB swing 15# 2 x 15 - cued for proper hip thrust to use momentum to swing KB rather than lift Pallof press with red power band 2 x 10 each   [facility] Adult PT Treatment:                                                DATE: 08/10/22 Therapeutic Exercise: Elliptical level 4 x 5 minutes  Dynamic stretching: walking quad stretch, Frankenstein, walking piriformis stretch, walking hamstring stretch 2 x 15 ft each  Forward lunge 2 x 10  Reverse lunge 2 x 10  Step up knee driver to triple extension 2 x 10; 8 inch step  Updated HEP    PATIENT EDUCATION:  Education details: n/a Person educated: n/a Education method: n/a Education comprehension: n/a  HOME EXERCISE PROGRAM: Access Code: [email or web address]    ASSESSMENT: CLINICAL IMPRESSION: Patient tolerated therapy well with no adverse effects. Continued progression of standing activity with patient demonstrating good lumbopelvic stability with closed chain strengthening. No reports of back pain throughout session, but occasional complaints of Lt arch pain that limited ability to progress into plyometric activity at this time.     OBJECTIVE IMPAIRMENTS: decreased ROM, decreased strength, hypomobility, increased fascial restrictions, postural dysfunction, and pain.    ACTIVITY LIMITATIONS: carrying, lifting, bending, sitting, standing, squatting, and locomotion level   PARTICIPATION LIMITATIONS: community  activity, occupation, and recreational activity   PERSONAL  FACTORS: Age, Profession, and Time since onset of injury/illness/exacerbation are also affecting patient's functional outcome.      GOALS: Goals reviewed with patient? Yes   SHORT TERM GOALS: Target date: 08/05/22   Patient will be independent and compliant with initial HEP.  Baseline: issued at eval  Goal status: met   2.  Patient will demonstrate pain free lumbar AROM to improve ability to complete reaching and bending activity.  Baseline: see above  Goal status: ongoing    LONG TERM GOALS: Target date: 08/26/22   Patient will demonstrate 5/5 bilateral hip strength to improve stability about the chain with prolonged walking and standing.  Baseline: see above  Goal status: INITIAL   2.  Patient will report pain as </=2/10 at worst to improve her tolerance to work-specific activity.  Baseline: 6/10 Goal status: INITIAL   3.  Patient will score at least 66% on FOTO to signify clinically meaningful improvement in functional abilities.  Baseline: 53% Goal status: INITIAL   4.  Patient will return to normal exercise regimen without limitations as it relates to her back.  Baseline: has not participate in rowing and pickle ball  Goal status: INITIAL     PLAN: PT FREQUENCY: 1-2x/week   PT DURATION: other: 4-6 weeks   PLANNED INTERVENTIONS: Therapeutic exercises, Therapeutic activity, Neuromuscular re-education, Balance training, Patient/Family education, Self Care, Dry Needling, Electrical stimulation, Spinal manipulation, Spinal mobilization, Cryotherapy, Moist heat, Taping, Traction, Manual therapy, and Re-evaluation.   PLAN FOR NEXT SESSION: review and progress HEP; manual/TPDN to lumbar spine; spinal mobility; gradually introduce dynamic lumbar strengthening for pickleball; anticipate D/C   Gwendolyn Grant, PT, DPT, ATC 08/17/22 11:50 AM

## 2022-08-18 NOTE — Therapy (Signed)
OUTPATIENT PHYSICAL THERAPY TREATMENT NOTE PHYSICAL THERAPY DISCHARGE SUMMARY  Visits from Start of Care: 10  Current functional level related to goals / functional outcomes: See goals below    Remaining deficits: Mild intermittent LBP   Education / Equipment: See education below    Patient agrees to discharge. Patient goals were  mostly met . Patient is being discharged due to being pleased with the current functional level.   Patient Name: Stacie [name] MRN: [id number] DOB:12-Jun-1961, 61 y.o., female 9 Date: 08/19/2022  PCP: Faustino Congress, NP REFERRING PROVIDER: Lyndal Pulley, DO   END OF SESSION:   PT End of Session - 08/19/22 1146     Visit Number 10    Number of Visits 13    Date for PT Re-Evaluation 08/29/22    Authorization Type [facility] [employer]    PT Start Time 1146    PT Stop Time 1226    PT Time Calculation (min) 40 min    Activity Tolerance Patient tolerated treatment well    Behavior During Therapy [facility] for tasks assessed/performed                    Past Medical History:  Diagnosis Date   Breast cancer ([place]) 2011   DCIS   Cancer [facility])    Past Surgical History:  Procedure Laterality Date   BREAST LUMPECTOMY Right 2011   DCIS   Patient Active Problem List   Diagnosis Date Noted   Lumbar paraspinal muscle spasm 06/09/2022   Medial epicondylitis of elbow, right 10/07/2021   Loss of transverse plantar arch of left foot 10/07/2021   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   Depression 01/11/2014   Essential hypertension 01/11/2014   Migraine 01/11/2014   Mixed hyperlipidemia 01/11/2014   NASH (nonalcoholic steatohepatitis) 01/11/2014   Sleep disturbance 01/11/2014    REFERRING DIAG: Lumbar paraspinal muscle spasm, Lumbar spine pain  THERAPY DIAG:  Other low back pain  Muscle weakness (generalized)  Rationale for Evaluation and Treatment Rehabilitation  PERTINENT HISTORY: History of breast cancer    PRECAUTIONS: None   SUBJECTIVE:       SUBJECTIVE STATEMENT:  Patient reports she is doing better and feels that she is ready for discharge. Has been able to participate in pickle ball and been able to complete rowing machine.  PAIN:  Are you having pain? Yes:  NPRS scale: 0.5/10 Pain location: center of low back  Pain description: twinge  Aggravating factors: prolonged standing,walking, sitting Relieving factors: laying on back with legs elevated; medication  PATIENT GOALS: "I want to be able to resume normal activities and get through work without pain." (Wants to hike, play pickle ball, run).    OBJECTIVE: (objective measures completed at initial evaluation unless otherwise dated) PATIENT SURVEYS:  FOTO 53% function to 66% predicted  08/19/22: 70%   POSTURE: decreased lumbar lordosis   PALPATION: L3-L5 PAIVM hypomobile and concordant pain  TTP Lt lumbar paraspinals; tautness bilaterally    LUMBAR ROM:    AROM eval 07/27/22 08/03/22 08/06/22 08/18/22  Flexion WNL pain  WNL; pain  WNL; pain WNL; pain (improved post manipulation)  WNL  Extension 25% limited pain  WNL; pain WNL; pain  NWL; pain (improved post manipulation) WNL mild pinching sensation  Right lateral flexion WNL WNL WNL; pain  WNL  Left lateral flexion WNL pain WNL; pain  WNL; pain   WNL  Right rotation WNL  WNL  WNL  Left rotation WNL  WNL  WNL   (Blank rows = not tested)     LOWER EXTREMITY MMT:     MMT Right eval Left eval Rt / Lt 07/22/2022 08/18/22  Hip flexion 4 4  5/5 bilateral  Hip extension 4 pn 4 pn 4 / 4 5/5 bilateral  Hip abduction 5 5  5/5 bilateral   Hip adduction        Hip internal rotation        Hip external rotation        Knee flexion        Knee extension        Ankle dorsiflexion        Ankle plantarflexion        Ankle inversion        Ankle eversion         (Blank rows = not tested)   LUMBAR SPECIAL TESTS:  SLR (-)  Ely's on Rt caused LBP on the left      TODAY'S  TREATMENT:   [facility] Adult PT Treatment:                                                DATE: 08/18/22 Therapeutic Exercise: Ellipitical level 4 x 5 minutes  Reviewed and updated HEP discussing frequency, duration, sets, reps, and ways to progress independently (demosntrating and returning demo as needed) Discussed frequency of workout and recreational activity regimen.   Therapeutic Activity: Re-assessment to determine overall progress, educating patient on progress towards goals.    [facility] Adult PT Treatment:                                                DATE: 08/17/22 Therapeutic Exercise: Dynamic warm-up: walking quad stretch, walking piriformis stretch, walking hamstring stretch, frankensteins 2 x 10 ft each  Walking lunge with med ball rotation; 8 lbs 2 sets d/b x 10 ft  Wall squat with resisted shoulder flexion 2 x 10; 3# Kettlebell swings 2 x 15; 15# Reverse lunge on slider 2 x 10 bilateral  Jump squats x 4; d/c due to foot pain Hamstring curls on stability ball 2 x 15 Dead bug x 20    [facility] Adult PT Treatment:                                                DATE: 08/13/22 Therapeutic Exercise: Recumbent bike L3 x 3 min while taking subjective Supine lumbar rotation stretch x 5 each Cat cow x 10 Bird dog x 10 SL bridge x 8 each Deadlift 30# DB 2 x 10 - cued for proper hip hinge maintaining neutral lumbar spine Farmer's march with unilateral 25# carry x 20 each hand Forward lunge with trunk rotation over front leg holding red med ball x 1 length of gym down/back Lateral band walk with blue at knees 2 x 1 length of gym down/back KB swing 15# 2 x 15 - cued for proper hip thrust to use momentum to swing KB rather than lift Pallof press with red power band 2 x 10 each   PATIENT EDUCATION:  Education details:  see treatment Person educated: patient Education method: instruction, handout, demo, cues Education comprehension: returned demo  HOME EXERCISE PROGRAM: Access Code:  [email or web address]    ASSESSMENT: CLINICAL IMPRESSION: Patient has progressed well since the start of care having met all established functional goals with the exception of pain free lumbar AROM as she continues to report mild pain at end range of lumbar extension AROM. She demonstrates improvements in hip and core strength and has recently returned to pickle ball and other work out activity without limitations as it relates to her back. She demonstrates independence in advanced home program and is pleased with her progress in PT. She is therefore appropriate for d/c at this time with patient in agreement with this plan.     OBJECTIVE IMPAIRMENTS: decreased ROM, decreased strength, hypomobility, increased fascial restrictions, postural dysfunction, and pain.    ACTIVITY LIMITATIONS: carrying, lifting, bending, sitting, standing, squatting, and locomotion level   PARTICIPATION LIMITATIONS: community activity, occupation, and recreational activity   PERSONAL FACTORS: Age, Profession, and Time since onset of injury/illness/exacerbation are also affecting patient's functional outcome.      GOALS: Goals reviewed with patient? Yes   SHORT TERM GOALS: Target date: 08/05/22   Patient will be independent and compliant with initial HEP.  Baseline: issued at eval  Goal status: met   2.  Patient will demonstrate pain free lumbar AROM to improve ability to complete reaching and bending activity.  Baseline: see above  Goal status: nearly met    LONG TERM GOALS: Target date: 08/26/22   Patient will demonstrate 5/5 bilateral hip strength to improve stability about the chain with prolonged walking and standing.  Baseline: see above  Goal status: met   2.  Patient will report pain as </=2/10 at worst to improve her tolerance to work-specific activity.  Baseline: 6/10 Status: 0.5/10  Goal status: met   3.  Patient will score at least 66% on FOTO to signify clinically meaningful improvement in functional  abilities.  Baseline: 53% Goal status: met   4.  Patient will return to normal exercise regimen without limitations as it relates to her back.  Baseline: has not participate in rowing and pickle ball  Status: has begun participating in pickle ball and warm-ups on row machine  Goal status: met     PLAN: PT FREQUENCY: n/a   PT DURATION: n/a PLANNED INTERVENTIONS: Therapeutic exercises, Therapeutic activity, Neuromuscular re-education, Balance training, Patient/Family education, Self Care, Dry Needling, Electrical stimulation, Spinal manipulation, Spinal mobilization, Cryotherapy, Moist heat, Taping, Traction, Manual therapy, and Re-evaluation.   PLAN FOR NEXT SESSION: n/a  Gwendolyn Grant, PT, DPT, ATC 08/19/22 1:34 PM

## 2022-08-19 ENCOUNTER — Ambulatory Visit: Payer: No Typology Code available for payment source

## 2022-08-19 DIAGNOSIS — M5459 Other low back pain: Secondary | ICD-10-CM | POA: Diagnosis not present

## 2022-08-19 DIAGNOSIS — M6281 Muscle weakness (generalized): Secondary | ICD-10-CM

## 2022-08-28 NOTE — Progress Notes (Deleted)
[place] Phone: [phone] Subjective:    I'm seeing this patient by the request  of:  Faustino Congress, NP  CC:   [id number]  07/07/2022 Continues to have a spasm. Does have some radicular symptoms that we will continue to monitor. X-rays were fairly unremarkable. Does have a history of breast cancer though previously. We did discussed if any worsening symptoms I would like to consider the possibility of a MRI. Patient would like to start with formal physical therapy referred today. Discussed with patient to continue the Zanaflex 2 mg up to twice a day follow-up with me again in 6 to 8 weeks.   Continue breakdown, home exercises given, discussed proper shoes, but continuing to have trouble will need to consider the possibility of custom orthotics.  Follow-up again in 6 to 8 weeks       Update 09/01/2022 Stacie [name] is a 61 y.o. female coming in with complaint of lumbar spine pain. Has been working with physical therapy. F/u for loss of transverse arch as well. 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)    Past Surgical History:  Procedure Laterality Date   BREAST LUMPECTOMY Right 2011   DCIS   Social History   Socioeconomic History   Marital status: Single    Spouse name: Not on file   Number of children: Not on file   Years of education: Not on file   Highest education level: Not on file  Occupational History   Not on file  Tobacco Use   Smoking status: Never   Smokeless tobacco: Never  Substance and Sexual Activity   Alcohol use: Not on file   Drug use: Not on file   Sexual activity: Not on file  Other Topics Concern   Not on file  Social History Narrative   Not on file   Social Determinants of Health   Financial Resource Strain: Not on file  Food Insecurity: Not on file  Transportation Needs: Not on  file  Physical Activity: Not on file  Stress: Not on file  Social Connections: Not on file   Allergies  Allergen Reactions   Codeine Other (See Comments)    Hydrocodone and Oxycodone okay per patient hallucinations    Penicillins Hives and Rash   Oxycodone Nausea Only   No family history on file.   Current Outpatient Medications (Cardiovascular):    rosuvastatin (CRESTOR) 5 MG tablet, Take 5 mg by mouth daily.   rosuvastatin (CRESTOR) 5 MG tablet, TAKE 1 TABLET BY MOUTH ONCE A DAY   rosuvastatin (CRESTOR) 5 MG tablet, Take 1 tablet (5 mg total) by mouth daily.   rosuvastatin (CRESTOR) 5 MG tablet, Take 1 tablet (5 mg total) by mouth daily.  Current Outpatient Medications (Respiratory):    cetirizine (ZYRTEC) 10 MG tablet, Take 10 mg by mouth daily.   chlorpheniramine (CHLOR-TRIMETON) 4 MG tablet, Take 4 mg by mouth 2 (two) times daily as needed for allergies.  Current Outpatient Medications (Analgesics):    SUMAtriptan (IMITREX) 50 MG tablet, Take 50 mg by mouth as needed.    SUMAtriptan (IMITREX) 50 MG tablet, TAKE 1 TABLET BY MOUTH ONCE A DAY AS NEEDED   Current Outpatient Medications (Other):    CALCIUM-VITAMIN D PO, Take 1 tablet by mouth 2 (two) times daily.   citalopram (CELEXA) 10 MG tablet,  Take 1 tablet (10 mg total) by mouth daily.   citalopram (CELEXA) 10 MG tablet, Take 1 tablet (10 mg total) by mouth daily.   citalopram (CELEXA) 10 MG tablet, Take 1 tablet (10 mg total) by mouth daily.   citalopram (CELEXA) 20 MG tablet, Take 1 tablet (20 mg total) by mouth daily.   Coenzyme Q10 (COQ10) 200 MG CAPS, Take 1 tablet by mouth daily.   COVID-19 At Home Antigen Test [facility] COVID-19 HOME TEST) KIT, use as directed   COVID-19 At Home Antigen Test (QUICKVUE AT-HOME COVID-19 TEST) KIT, Use as directed.   doxycycline (VIBRA-TABS) 100 MG tablet, Take 1 tablet (100 mg total) by mouth 2 (two) times daily.   Melatonin 3 MG CAPS, Take 1 capsule by mouth daily.   Multiple  Vitamin (MULTI-VITAMINS) TABS, Take by mouth.   Omega-3 Fatty Acids (FISH OIL) 1000 MG CAPS, Take by mouth.   tiZANidine (ZANAFLEX) 2 MG tablet, Take 1 tablet (2 mg total) by mouth at bedtime.   Turmeric Curcumin 500 MG CAPS, Take by mouth.   Reviewed prior external information including notes and imaging from  primary care provider As well as notes that were available from care everywhere and other healthcare systems.  Past medical history, social, surgical and family history all reviewed in electronic medical record.  No pertanent information unless stated regarding to the chief complaint.   Review of Systems:  No headache, visual changes, nausea, vomiting, diarrhea, constipation, dizziness, abdominal pain, skin rash, fevers, chills, night sweats, weight loss, swollen lymph nodes, body aches, joint swelling, chest pain, shortness of breath, mood changes. POSITIVE muscle aches  Objective  There were no vitals taken for this visit.   General: No apparent distress alert and oriented x3 mood and affect normal, dressed appropriately.  HEENT: Pupils equal, extraocular movements intact  Respiratory: Patient's speak in full sentences and does not appear short of breath  Cardiovascular: No lower extremity edema, non tender, no erythema      Impression and Recommendations:

## 2022-09-01 ENCOUNTER — Ambulatory Visit: Payer: No Typology Code available for payment source | Admitting: Family Medicine

## 2022-10-28 ENCOUNTER — Other Ambulatory Visit: Payer: Self-pay

## 2022-10-29 ENCOUNTER — Encounter (HOSPITAL_COMMUNITY): Payer: Self-pay

## 2022-10-29 ENCOUNTER — Other Ambulatory Visit (HOSPITAL_COMMUNITY): Payer: Self-pay

## 2022-10-30 ENCOUNTER — Other Ambulatory Visit (HOSPITAL_COMMUNITY): Payer: Self-pay

## 2022-11-02 ENCOUNTER — Other Ambulatory Visit: Payer: Self-pay

## 2022-11-02 ENCOUNTER — Other Ambulatory Visit (HOSPITAL_COMMUNITY): Payer: Self-pay

## 2022-11-10 ENCOUNTER — Other Ambulatory Visit (HOSPITAL_COMMUNITY): Payer: Self-pay

## 2022-11-10 ENCOUNTER — Other Ambulatory Visit: Payer: Self-pay

## 2022-12-07 DIAGNOSIS — Z780 Asymptomatic menopausal state: Secondary | ICD-10-CM | POA: Diagnosis not present

## 2022-12-07 DIAGNOSIS — J302 Other seasonal allergic rhinitis: Secondary | ICD-10-CM | POA: Diagnosis not present

## 2022-12-07 DIAGNOSIS — Z0000 Encounter for general adult medical examination without abnormal findings: Secondary | ICD-10-CM | POA: Diagnosis not present

## 2022-12-07 DIAGNOSIS — E7800 Pure hypercholesterolemia, unspecified: Secondary | ICD-10-CM | POA: Diagnosis not present

## 2022-12-07 DIAGNOSIS — I10 Essential (primary) hypertension: Secondary | ICD-10-CM | POA: Diagnosis not present

## 2022-12-07 DIAGNOSIS — Z6821 Body mass index (BMI) 21.0-21.9, adult: Secondary | ICD-10-CM | POA: Diagnosis not present

## 2022-12-07 DIAGNOSIS — F339 Major depressive disorder, recurrent, unspecified: Secondary | ICD-10-CM | POA: Diagnosis not present

## 2022-12-07 DIAGNOSIS — K7581 Nonalcoholic steatohepatitis (NASH): Secondary | ICD-10-CM | POA: Diagnosis not present

## 2022-12-07 DIAGNOSIS — G43909 Migraine, unspecified, not intractable, without status migrainosus: Secondary | ICD-10-CM | POA: Diagnosis not present

## 2022-12-07 DIAGNOSIS — Z853 Personal history of malignant neoplasm of breast: Secondary | ICD-10-CM | POA: Diagnosis not present

## 2022-12-08 ENCOUNTER — Other Ambulatory Visit: Payer: Self-pay | Admitting: Family Medicine

## 2022-12-08 DIAGNOSIS — E2839 Other primary ovarian failure: Secondary | ICD-10-CM

## 2022-12-10 ENCOUNTER — Other Ambulatory Visit (HOSPITAL_COMMUNITY): Payer: Self-pay

## 2022-12-10 MED ORDER — ROSUVASTATIN CALCIUM 10 MG PO TABS
10.0000 mg | ORAL_TABLET | Freq: Every day | ORAL | 1 refills | Status: DC
  Filled 2022-12-10: qty 90, 90d supply, fill #0
  Filled 2023-05-12: qty 90, 90d supply, fill #1

## 2023-02-11 ENCOUNTER — Other Ambulatory Visit (HOSPITAL_COMMUNITY): Payer: Self-pay

## 2023-02-11 MED ORDER — CITALOPRAM HYDROBROMIDE 10 MG PO TABS
10.0000 mg | ORAL_TABLET | Freq: Every day | ORAL | 2 refills | Status: DC
  Filled 2023-02-11: qty 90, 90d supply, fill #0
  Filled 2023-05-12: qty 90, 90d supply, fill #1
  Filled 2023-08-09: qty 90, 90d supply, fill #2

## 2023-03-15 ENCOUNTER — Other Ambulatory Visit (HOSPITAL_COMMUNITY): Payer: Self-pay

## 2023-03-15 MED ORDER — SUMATRIPTAN SUCCINATE 50 MG PO TABS
50.0000 mg | ORAL_TABLET | Freq: Every day | ORAL | 1 refills | Status: AC
  Filled 2023-03-15: qty 6, 6d supply, fill #0
  Filled 2023-03-15: qty 12, 12d supply, fill #0
  Filled 2023-03-15: qty 6, 6d supply, fill #0
  Filled 2023-03-15: qty 12, 24d supply, fill #0

## 2023-03-18 ENCOUNTER — Other Ambulatory Visit (HOSPITAL_COMMUNITY): Payer: Self-pay

## 2023-06-10 DIAGNOSIS — G43909 Migraine, unspecified, not intractable, without status migrainosus: Secondary | ICD-10-CM | POA: Diagnosis not present

## 2023-06-10 DIAGNOSIS — Z23 Encounter for immunization: Secondary | ICD-10-CM | POA: Diagnosis not present

## 2023-06-10 DIAGNOSIS — E7800 Pure hypercholesterolemia, unspecified: Secondary | ICD-10-CM | POA: Diagnosis not present

## 2023-07-12 ENCOUNTER — Other Ambulatory Visit: Payer: Self-pay | Admitting: Family Medicine

## 2023-07-12 DIAGNOSIS — Z1231 Encounter for screening mammogram for malignant neoplasm of breast: Secondary | ICD-10-CM

## 2023-08-05 DIAGNOSIS — Z135 Encounter for screening for eye and ear disorders: Secondary | ICD-10-CM | POA: Diagnosis not present

## 2023-08-05 DIAGNOSIS — H52223 Regular astigmatism, bilateral: Secondary | ICD-10-CM | POA: Diagnosis not present

## 2023-08-05 DIAGNOSIS — H524 Presbyopia: Secondary | ICD-10-CM | POA: Diagnosis not present

## 2023-08-05 DIAGNOSIS — H5203 Hypermetropia, bilateral: Secondary | ICD-10-CM | POA: Diagnosis not present

## 2023-08-09 ENCOUNTER — Other Ambulatory Visit (HOSPITAL_COMMUNITY): Payer: Self-pay

## 2023-08-09 ENCOUNTER — Other Ambulatory Visit: Payer: Self-pay

## 2023-08-09 MED ORDER — ROSUVASTATIN CALCIUM 10 MG PO TABS
10.0000 mg | ORAL_TABLET | Freq: Every day | ORAL | 1 refills | Status: DC
  Filled 2023-08-09: qty 90, 90d supply, fill #0
  Filled 2023-11-06: qty 90, 90d supply, fill #1

## 2023-08-11 ENCOUNTER — Ambulatory Visit: Payer: 59

## 2023-09-07 ENCOUNTER — Ambulatory Visit
Admission: RE | Admit: 2023-09-07 | Discharge: 2023-09-07 | Disposition: A | Discharge status: Home / Self Care | Payer: 59 | Source: Ambulatory Visit | Attending: Family Medicine | Admitting: Family Medicine

## 2023-09-07 DIAGNOSIS — Z1231 Encounter for screening mammogram for malignant neoplasm of breast: Secondary | ICD-10-CM

## 2023-11-05 ENCOUNTER — Other Ambulatory Visit (HOSPITAL_COMMUNITY): Payer: Self-pay

## 2023-11-05 MED ORDER — CITALOPRAM HYDROBROMIDE 10 MG PO TABS
10.0000 mg | ORAL_TABLET | Freq: Every day | ORAL | 1 refills | Status: DC
  Filled 2023-11-05: qty 90, 90d supply, fill #0
  Filled 2024-01-31: qty 90, 90d supply, fill #1

## 2024-01-13 DIAGNOSIS — Z1211 Encounter for screening for malignant neoplasm of colon: Secondary | ICD-10-CM | POA: Diagnosis not present

## 2024-01-13 DIAGNOSIS — E7800 Pure hypercholesterolemia, unspecified: Secondary | ICD-10-CM | POA: Diagnosis not present

## 2024-01-13 DIAGNOSIS — G43909 Migraine, unspecified, not intractable, without status migrainosus: Secondary | ICD-10-CM | POA: Diagnosis not present

## 2024-01-13 DIAGNOSIS — F339 Major depressive disorder, recurrent, unspecified: Secondary | ICD-10-CM | POA: Diagnosis not present

## 2024-01-13 DIAGNOSIS — Z0000 Encounter for general adult medical examination without abnormal findings: Secondary | ICD-10-CM | POA: Diagnosis not present

## 2024-01-13 DIAGNOSIS — Z780 Asymptomatic menopausal state: Secondary | ICD-10-CM | POA: Diagnosis not present

## 2024-01-18 ENCOUNTER — Other Ambulatory Visit: Payer: Self-pay | Admitting: Family Medicine

## 2024-01-18 DIAGNOSIS — Z780 Asymptomatic menopausal state: Secondary | ICD-10-CM

## 2024-01-25 DIAGNOSIS — Z1212 Encounter for screening for malignant neoplasm of rectum: Secondary | ICD-10-CM | POA: Diagnosis not present

## 2024-01-25 DIAGNOSIS — Z1211 Encounter for screening for malignant neoplasm of colon: Secondary | ICD-10-CM | POA: Diagnosis not present

## 2024-01-31 ENCOUNTER — Other Ambulatory Visit (HOSPITAL_COMMUNITY): Payer: Self-pay

## 2024-01-31 MED ORDER — ROSUVASTATIN CALCIUM 10 MG PO TABS
10.0000 mg | ORAL_TABLET | Freq: Every day | ORAL | 2 refills | Status: AC
  Filled 2024-01-31: qty 90, 90d supply, fill #0
  Filled 2024-04-28: qty 90, 90d supply, fill #1
  Filled 2024-07-25: qty 90, 90d supply, fill #2

## 2024-04-28 ENCOUNTER — Other Ambulatory Visit (HOSPITAL_COMMUNITY): Payer: Self-pay

## 2024-05-01 ENCOUNTER — Other Ambulatory Visit (HOSPITAL_COMMUNITY): Payer: Self-pay

## 2024-05-01 MED ORDER — CITALOPRAM HYDROBROMIDE 10 MG PO TABS
10.0000 mg | ORAL_TABLET | Freq: Every day | ORAL | 0 refills | Status: AC
  Filled 2024-05-01: qty 90, 90d supply, fill #0

## 2024-05-04 ENCOUNTER — Ambulatory Visit (HOSPITAL_BASED_OUTPATIENT_CLINIC_OR_DEPARTMENT_OTHER)
Admission: RE | Admit: 2024-05-04 | Discharge: 2024-05-04 | Disposition: A | Discharge status: Home / Self Care | Source: Ambulatory Visit | Attending: Family Medicine | Admitting: Family Medicine

## 2024-05-04 DIAGNOSIS — Z780 Asymptomatic menopausal state: Secondary | ICD-10-CM | POA: Diagnosis not present

## 2024-05-04 DIAGNOSIS — M8588 Other specified disorders of bone density and structure, other site: Secondary | ICD-10-CM | POA: Diagnosis not present

## 2024-07-13 DIAGNOSIS — Z23 Encounter for immunization: Secondary | ICD-10-CM | POA: Diagnosis not present

## 2024-07-13 DIAGNOSIS — F324 Major depressive disorder, single episode, in partial remission: Secondary | ICD-10-CM | POA: Diagnosis not present

## 2024-07-13 DIAGNOSIS — E7800 Pure hypercholesterolemia, unspecified: Secondary | ICD-10-CM | POA: Diagnosis not present

## 2024-07-13 DIAGNOSIS — G43909 Migraine, unspecified, not intractable, without status migrainosus: Secondary | ICD-10-CM | POA: Diagnosis not present

## 2024-07-13 DIAGNOSIS — Z853 Personal history of malignant neoplasm of breast: Secondary | ICD-10-CM | POA: Diagnosis not present

## 2024-07-13 DIAGNOSIS — Z6821 Body mass index (BMI) 21.0-21.9, adult: Secondary | ICD-10-CM | POA: Diagnosis not present

## 2024-07-24 ENCOUNTER — Other Ambulatory Visit (HOSPITAL_COMMUNITY): Payer: Self-pay

## 2024-07-25 ENCOUNTER — Other Ambulatory Visit: Payer: Self-pay

## 2024-07-25 ENCOUNTER — Other Ambulatory Visit (HOSPITAL_COMMUNITY): Payer: Self-pay

## 2024-07-25 MED ORDER — CITALOPRAM HYDROBROMIDE 10 MG PO TABS
10.0000 mg | ORAL_TABLET | Freq: Every day | ORAL | 1 refills | Status: AC
  Filled 2024-07-25 – 2024-07-31 (×2): qty 90, 90d supply, fill #0
  Filled 2024-10-25: qty 90, 90d supply, fill #1

## 2024-07-29 ENCOUNTER — Encounter (HOSPITAL_COMMUNITY): Payer: Self-pay

## 2024-07-31 ENCOUNTER — Other Ambulatory Visit (HOSPITAL_COMMUNITY): Payer: Self-pay

## 2024-08-04 DIAGNOSIS — H52223 Regular astigmatism, bilateral: Secondary | ICD-10-CM | POA: Diagnosis not present

## 2024-08-04 DIAGNOSIS — Z135 Encounter for screening for eye and ear disorders: Secondary | ICD-10-CM | POA: Diagnosis not present

## 2024-08-04 DIAGNOSIS — H524 Presbyopia: Secondary | ICD-10-CM | POA: Diagnosis not present

## 2024-08-04 DIAGNOSIS — H04123 Dry eye syndrome of bilateral lacrimal glands: Secondary | ICD-10-CM | POA: Diagnosis not present

## 2024-08-04 DIAGNOSIS — H5203 Hypermetropia, bilateral: Secondary | ICD-10-CM | POA: Diagnosis not present

## 2024-10-04 ENCOUNTER — Other Ambulatory Visit

## 2024-10-25 ENCOUNTER — Other Ambulatory Visit (HOSPITAL_COMMUNITY): Payer: Self-pay

## 2024-10-25 ENCOUNTER — Other Ambulatory Visit: Payer: Self-pay

## 2024-10-25 MED ORDER — ROSUVASTATIN CALCIUM 10 MG PO TABS
10.0000 mg | ORAL_TABLET | Freq: Every day | ORAL | 0 refills | Status: AC
  Filled 2024-10-25: qty 90, 90d supply, fill #0
